# Patient Record
Sex: Female | Born: 1980 | Race: White | Hispanic: No | Marital: Married | Smoking: Former smoker
Health system: Southern US, Community
[De-identification: ages and names within clinical notes are randomized; demographics above are authoritative.]

## PROBLEM LIST (undated history)

## (undated) DIAGNOSIS — IMO0001 Reserved for inherently not codable concepts without codable children: Secondary | ICD-10-CM

## (undated) DIAGNOSIS — IMO0002 Reserved for concepts with insufficient information to code with codable children: Secondary | ICD-10-CM

## (undated) DIAGNOSIS — C801 Malignant (primary) neoplasm, unspecified: Secondary | ICD-10-CM

## (undated) DIAGNOSIS — L409 Psoriasis, unspecified: Secondary | ICD-10-CM

## (undated) DIAGNOSIS — F419 Anxiety disorder, unspecified: Secondary | ICD-10-CM

## (undated) HISTORY — DX: Anxiety disorder, unspecified: F41.9

## (undated) HISTORY — PX: ABDOMINAL HYSTERECTOMY: SHX81

## (undated) HISTORY — DX: Psoriasis, unspecified: L40.9

## (undated) HISTORY — DX: Reserved for concepts with insufficient information to code with codable children: IMO0002

## (undated) HISTORY — PX: OTHER SURGICAL HISTORY: SHX169

## (undated) HISTORY — DX: Reserved for inherently not codable concepts without codable children: IMO0001

---

## 1998-08-13 ENCOUNTER — Ambulatory Visit (HOSPITAL_COMMUNITY): Admission: RE | Admit: 1998-08-13 | Discharge: 1998-08-13 | Payer: Self-pay | Admitting: Pediatrics

## 1999-10-03 ENCOUNTER — Encounter: Payer: Self-pay | Admitting: Family Medicine

## 1999-10-03 ENCOUNTER — Encounter: Admission: RE | Admit: 1999-10-03 | Discharge: 1999-10-03 | Payer: Self-pay | Admitting: Family Medicine

## 2000-08-06 ENCOUNTER — Other Ambulatory Visit: Admission: RE | Admit: 2000-08-06 | Discharge: 2000-08-06 | Payer: Self-pay | Admitting: Obstetrics and Gynecology

## 2001-01-14 ENCOUNTER — Emergency Department (HOSPITAL_COMMUNITY): Admission: EM | Admit: 2001-01-14 | Discharge: 2001-01-14 | Payer: Self-pay | Admitting: Emergency Medicine

## 2001-05-14 ENCOUNTER — Other Ambulatory Visit: Admission: RE | Admit: 2001-05-14 | Discharge: 2001-05-14 | Payer: Self-pay | Admitting: Obstetrics and Gynecology

## 2002-09-01 ENCOUNTER — Ambulatory Visit (HOSPITAL_COMMUNITY): Admission: RE | Admit: 2002-09-01 | Discharge: 2002-09-01 | Payer: Self-pay | Admitting: *Deleted

## 2003-02-04 ENCOUNTER — Inpatient Hospital Stay (HOSPITAL_COMMUNITY): Admission: AD | Admit: 2003-02-04 | Discharge: 2003-02-06 | Payer: Self-pay | Admitting: *Deleted

## 2003-06-21 ENCOUNTER — Emergency Department (HOSPITAL_COMMUNITY): Admission: EM | Admit: 2003-06-21 | Discharge: 2003-06-21 | Payer: Self-pay | Admitting: Emergency Medicine

## 2006-09-20 ENCOUNTER — Other Ambulatory Visit: Admission: RE | Admit: 2006-09-20 | Discharge: 2006-09-20 | Payer: Self-pay | Admitting: Family Medicine

## 2007-03-31 ENCOUNTER — Inpatient Hospital Stay (HOSPITAL_COMMUNITY): Admission: AD | Admit: 2007-03-31 | Discharge: 2007-03-31 | Payer: Self-pay | Admitting: Gynecology

## 2007-07-15 ENCOUNTER — Inpatient Hospital Stay (HOSPITAL_COMMUNITY): Admission: AD | Admit: 2007-07-15 | Discharge: 2007-07-16 | Payer: Self-pay | Admitting: Obstetrics and Gynecology

## 2010-01-15 ENCOUNTER — Emergency Department (HOSPITAL_BASED_OUTPATIENT_CLINIC_OR_DEPARTMENT_OTHER): Admission: EM | Admit: 2010-01-15 | Discharge: 2010-01-15 | Payer: Self-pay | Admitting: Emergency Medicine

## 2010-09-19 ENCOUNTER — Other Ambulatory Visit: Admission: RE | Admit: 2010-09-19 | Discharge: 2010-09-19 | Payer: Self-pay | Admitting: Family Medicine

## 2011-09-25 LAB — CBC
HCT: 28.1 — ABNORMAL LOW
HCT: 32.7 — ABNORMAL LOW
Hemoglobin: 11.1 — ABNORMAL LOW
Hemoglobin: 9.6 — ABNORMAL LOW
MCHC: 33.9
MCHC: 34.2
MCV: 83
MCV: 83.6
Platelets: 202
Platelets: 231
RBC: 3.36 — ABNORMAL LOW
RBC: 3.94
RDW: 14.5 — ABNORMAL HIGH
RDW: 14.7 — ABNORMAL HIGH
WBC: 13 — ABNORMAL HIGH
WBC: 15.7 — ABNORMAL HIGH

## 2011-09-25 LAB — RPR: RPR Ser Ql: NONREACTIVE

## 2014-03-11 DIAGNOSIS — C801 Malignant (primary) neoplasm, unspecified: Secondary | ICD-10-CM

## 2014-03-11 HISTORY — DX: Malignant (primary) neoplasm, unspecified: C80.1

## 2014-03-15 ENCOUNTER — Other Ambulatory Visit: Payer: Self-pay | Admitting: Hematology & Oncology

## 2014-03-15 DIAGNOSIS — C539 Malignant neoplasm of cervix uteri, unspecified: Secondary | ICD-10-CM

## 2014-03-16 ENCOUNTER — Telehealth: Payer: Self-pay | Admitting: Hematology & Oncology

## 2014-03-16 NOTE — Telephone Encounter (Signed)
Left message for pt to call for 3-7 appointments

## 2014-03-16 NOTE — Telephone Encounter (Signed)
Pt aware of 4-7 to drink at 10am and be NPO 4 hrs prior

## 2014-03-16 NOTE — Telephone Encounter (Signed)
Called to give pt 3-7 appointments per in basket. The person who answered said I had the wrong number. Dr. Marin Olp aware

## 2014-03-17 ENCOUNTER — Encounter (HOSPITAL_BASED_OUTPATIENT_CLINIC_OR_DEPARTMENT_OTHER): Payer: Self-pay

## 2014-03-17 ENCOUNTER — Other Ambulatory Visit (HOSPITAL_BASED_OUTPATIENT_CLINIC_OR_DEPARTMENT_OTHER): Payer: BC Managed Care – PPO | Admitting: Lab

## 2014-03-17 ENCOUNTER — Ambulatory Visit (HOSPITAL_BASED_OUTPATIENT_CLINIC_OR_DEPARTMENT_OTHER)
Admission: RE | Admit: 2014-03-17 | Discharge: 2014-03-17 | Disposition: A | Discharge status: Home / Self Care | Payer: BC Managed Care – PPO | Source: Ambulatory Visit | Attending: Hematology & Oncology | Admitting: Hematology & Oncology

## 2014-03-17 DIAGNOSIS — C539 Malignant neoplasm of cervix uteri, unspecified: Secondary | ICD-10-CM

## 2014-03-17 DIAGNOSIS — C763 Malignant neoplasm of pelvis: Secondary | ICD-10-CM | POA: Insufficient documentation

## 2014-03-17 DIAGNOSIS — C762 Malignant neoplasm of abdomen: Secondary | ICD-10-CM | POA: Insufficient documentation

## 2014-03-17 HISTORY — DX: Malignant (primary) neoplasm, unspecified: C80.1

## 2014-03-17 LAB — CMP (CANCER CENTER ONLY)
ALBUMIN: 4.4 g/dL (ref 3.3–5.5)
ALT(SGPT): 15 U/L (ref 10–47)
AST: 16 U/L (ref 11–38)
Alkaline Phosphatase: 63 U/L (ref 26–84)
BUN, Bld: 11 mg/dL (ref 7–22)
CALCIUM: 9.6 mg/dL (ref 8.0–10.3)
CO2: 29 meq/L (ref 18–33)
CREATININE: 0.7 mg/dL (ref 0.6–1.2)
Chloride: 106 mEq/L (ref 98–108)
GLUCOSE: 93 mg/dL (ref 73–118)
Potassium: 4 mEq/L (ref 3.3–4.7)
Sodium: 138 mEq/L (ref 128–145)
Total Bilirubin: 0.8 mg/dl (ref 0.20–1.60)
Total Protein: 7.7 g/dL (ref 6.4–8.1)

## 2014-03-17 LAB — CBC WITH DIFFERENTIAL (CANCER CENTER ONLY)
BASO#: 0 10*3/uL (ref 0.0–0.2)
BASO%: 0.3 % (ref 0.0–2.0)
EOS ABS: 0.1 10*3/uL (ref 0.0–0.5)
EOS%: 0.7 % (ref 0.0–7.0)
HCT: 41.6 % (ref 34.8–46.6)
HGB: 14.2 g/dL (ref 11.6–15.9)
LYMPH#: 2 10*3/uL (ref 0.9–3.3)
LYMPH%: 17.4 % (ref 14.0–48.0)
MCH: 30.3 pg (ref 26.0–34.0)
MCHC: 34.1 g/dL (ref 32.0–36.0)
MCV: 89 fL (ref 81–101)
MONO#: 0.5 10*3/uL (ref 0.1–0.9)
MONO%: 4.5 % (ref 0.0–13.0)
NEUT#: 8.8 10*3/uL — ABNORMAL HIGH (ref 1.5–6.5)
NEUT%: 77.1 % (ref 39.6–80.0)
PLATELETS: 273 10*3/uL (ref 145–400)
RBC: 4.68 10*6/uL (ref 3.70–5.32)
RDW: 13.3 % (ref 11.1–15.7)
WBC: 11.4 10*3/uL — ABNORMAL HIGH (ref 3.9–10.0)

## 2014-03-17 LAB — FERRITIN CHCC: Ferritin: 44 ng/ml (ref 9–269)

## 2014-03-17 LAB — IRON AND TIBC CHCC
%SAT: 35 % (ref 21–57)
IRON: 104 ug/dL (ref 41–142)
TIBC: 298 ug/dL (ref 236–444)
UIBC: 194 ug/dL (ref 120–384)

## 2014-03-17 LAB — CHCC SATELLITE - SMEAR

## 2014-03-17 MED ORDER — IOHEXOL 300 MG/ML  SOLN
100.0000 mL | Freq: Once | INTRAMUSCULAR | Status: AC | PRN
  Administered 2014-03-17: 100 mL via INTRAVENOUS

## 2014-03-18 ENCOUNTER — Encounter: Payer: Self-pay | Admitting: *Deleted

## 2014-03-20 ENCOUNTER — Encounter: Payer: Self-pay | Admitting: Gynecology

## 2014-03-20 ENCOUNTER — Encounter: Payer: Self-pay | Admitting: Gynecologic Oncology

## 2014-03-20 ENCOUNTER — Ambulatory Visit: Payer: BC Managed Care – PPO | Attending: Gynecology | Admitting: Gynecology

## 2014-03-20 VITALS — BP 115/54 | HR 72 | Temp 98.7°F | Resp 16 | Ht 61.5 in | Wt 124.5 lb

## 2014-03-20 DIAGNOSIS — Z833 Family history of diabetes mellitus: Secondary | ICD-10-CM | POA: Insufficient documentation

## 2014-03-20 DIAGNOSIS — F411 Generalized anxiety disorder: Secondary | ICD-10-CM | POA: Insufficient documentation

## 2014-03-20 DIAGNOSIS — L408 Other psoriasis: Secondary | ICD-10-CM | POA: Insufficient documentation

## 2014-03-20 DIAGNOSIS — C539 Malignant neoplasm of cervix uteri, unspecified: Secondary | ICD-10-CM

## 2014-03-20 DIAGNOSIS — Z8249 Family history of ischemic heart disease and other diseases of the circulatory system: Secondary | ICD-10-CM | POA: Insufficient documentation

## 2014-03-20 DIAGNOSIS — Z79899 Other long term (current) drug therapy: Secondary | ICD-10-CM | POA: Insufficient documentation

## 2014-03-20 DIAGNOSIS — Z823 Family history of stroke: Secondary | ICD-10-CM | POA: Insufficient documentation

## 2014-03-20 NOTE — Patient Instructions (Addendum)
You will have Radical Hysterectomy surgery on Bobby 21 with Dr. Fermin Schwab.                Preparing for your Surgery  Pre-operative Testing -You will receive a phone call from presurgical testing at [facility] to arrange for a pre-operative testing appointment before your surgery.  This appointment normally occurs one to two weeks before your scheduled surgery.   -Bring your insurance card, copy of an advanced directive if applicable, medication list  -At that visit, you will be asked to sign a consent for a possible blood transfusion in case a transfusion becomes necessary during surgery.  The need for a blood transfusion is rare but having consent is a necessary part of your care.     Day Before Surgery at [place] will be asked to take in only clear liquids the day before surgery.  Examples of clear liquids include broths, jello, and clear juices.  You may also be advised to perform a fleets enema the night before your surgery based off of your provider's recommendations.  You will be advised to have nothing to eat or drink after midnight the evening before.    Your role in recovery Your role is to become active as soon as directed by your doctor, while still giving yourself time to heal.  Rest when you feel tired. You will be asked to do the following in order to speed your recovery:  - Cough and breathe deeply. This helps toclear and expand your lungs and can prevent pneumonia. You may be given a spirometer to practice deep breathing. A staff member will show you how to use the spirometer. - Do mild physical activity. Walking or moving your legs help your circulation and body functions return to normal. A staff member will help you when you try to walk and will provide you with simple exercises. Do not try to get up or walk alone the first time. - Actively manage your pain. Managing your pain lets you move in comfort. We will ask you to rate your pain on a scale of zero to 10.  It is your responsibility to tell your doctor or nurse where and how much you hurt so your pain can be treated. Suprapubic Catheter Home Guide A suprapubic catheter is a rubber tube with a tiny balloon on the end. It is used to drain urine from the bladder. This catheter is put in your bladder through a small opening in the lower center part of your abdomen. Suprapubic refers to the area right above your pubic bone. The balloon on the end of the catheter is filled with germ-free (sterile) water. This keeps the catheter from slipping out. When the catheter is in place, your urine will drain into a collection bag. The bag can be put beside your bed at night or attached to your leg during the day. HOW TO CARE FOR YOUR CATHETER  Cleaning your skin  Clean the skin around the catheter opening every day.  Wash your hands with soap and water.  Clean the skin around the opening with a clean washcloth and soapy water. Do not pull on the tube.  Pat the area dry with a clean towel.  Your caregiver may want you to put a bandage (dressing) over the site. Do not use ointment on this area unless your caregiver tells you to. Cleaning the catheter  Ask your caregiver if you need to clean the catheter and how often.  Use only  soap and water.  There may be crusts on the catheter. Put hydrogen peroxide on a cotton ball or gauze pad to remove any crust. Emptying the collection bag  You may have a large drainage bag to use at night and a smaller one for daytime. Empty the large bag every 8 hours. Empty the small bag when it is about  full.  Keep the drainage bag below the level of the catheter. This keeps urine from flowing backwards.  Hold the bag over the toilet or another container. Release the valve (spigot) at the bottom of the bag. Do not touch the opening of the spigot. Do not let the opening touch the toilet or container.  Close the spigot tightly when the bag is empty. Cleaning the collection  bag  Clean the bag every few days.  First, wash your hands.  Disconnect the tubing from the catheter. Replace the used bag with a new bag. Then you can clean the used one.  Empty the used bag completely. Rinse it out with warm water and soap or fill the bag with water and add 1 teaspoon of vinegar. Let it sit for about 30 minutes. Then drain.  The bag should be completely dry before storing it. Put it inside a plastic bag to keep it clean. Checking everything  Always make sure there are no kinks in the catheter or tubing.  Always make sure there are no leaks in the catheter, tubing, or collection bag. HOW TO CHANGE YOUR CATHETER Sometimes, a caregiver will change your suprapubic catheter. Other times, you may need to change it yourself. This may be the case if you need to wear a catheter for a long time. Usually, they need to be changed every 4 to 6 weeks. Ask your caregiver how often yours should be changed. Your caregiver will help you order the following supplies for home delivery:  Sterile gloves.  Catheters.  Syringes.  Sterile water.  Sterile cleaning solution.  Lubricant.  Drainage bags. Changing your catheter  Drink plenty of fluids before changing the catheter.  Wash your hands with soap and water.  Lie on your back and put on sterile gloves.  Clean the skin around the catheter opening. Use the sterile cleaning solution.  Use a syringe to get the water out of the balloon from the old catheter.  Slowly remove the catheter.  Take off the first pair of gloves, and put on a new pair. Then put lubricant on the tip of the new catheter. Put the new catheter through the opening.  Wait for some urine to start flowing. Then, use the other syringe to fill the balloon with sterile water.  Attach the catheter to your drainage bag. Make sure the connection is tight. Important warnings  The catheter should come out easily. If it seems stuck, do not pull it.  Call  your caregiver right away if you have any trouble while changing the catheter.  When the old catheter is removed, the new one should be put in right away. This is because the opening will close quickly. If you have a problem, go to an emergency clinic right away. RISKS AND COMPLICATIONS  Urine flow can become blocked. This can happen if the catheter or tubes are not working right. A blood clot can also block urine flow.  The catheter might irritate tissue in your body. This can cause bleeding.  The skin near the opening for the catheter may become irritated or infected.  Bacteria may get  into your bladder. This can cause a urinary tract infection. HOME CARE INSTRUCTIONS  Take all medicines prescribed by your caregiver. Follow the directions carefully.  Drink 8 glasses of water every day. This produces good urine flow.  Check the skin around your catheter a few times every day. Watch for redness and swelling. Look for any fluids coming out of the opening.  Do not use powder or cream around the catheter opening.  Do not take tub baths or use pools or hot tubs.  Keep all follow-up appointments. SEEK MEDICAL CARE IF:  You leak urine.  Your skin around the catheter becomes red or sore.  Your urine flow slows down.  Your urine gets cloudy or smelly. SEEK IMMEDIATE MEDICAL CARE IF:   You have chills, nausea, or back pain.  You have trouble changing your catheter.  Your catheter comes out.  You have blood in your urine.  You have no urine flow for 1 hour.  You have a fever. Document Released: 08/15/2011 Document Revised: 02/19/2012 Document Reviewed: 08/15/2011 [facility] Patient Information 2014 [place].

## 2014-03-20 NOTE — Progress Notes (Signed)
Consult Note: Gyn-Onc   Stacy Buck 33 y.o. female  Chief Complaint  Patient presents with  . 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 The risks of surgery including hemorrhage, infection, injury to adjacent viscera, and thromboembolic complications were discussed. Bladder dysfunction and fistula were also mentioned as possible serious complications. She understands she'll have a suprapubic catheter. All questions are answered. Patient's aware that radiation therapy as an alternative which is equally good.   HPI:  33 year old gravida 2 para 2 seen in consultation request of Dr. Christophe Louis regarding management of a newly diagnosed cervical carcinoma. Patient believes she had her last Pap smear in 2011. Recently she had a Pap smear showing atypical squamous cells and high-grade dysplasia cannot be ruled out. 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 In retrospect the patient reports she's had postcoital bleeding for several months. She denies any pelvic pain or any GI or GU symptoms. Functional status is excellent.  She had a CT scan obtained on Brigid 7 that shows no evidence of metastatic disease  Patient has no other serious medical problems.  Review of Systems:10 point review of systems is negative except as noted in interval history.   Vitals: Blood pressure 115/54, pulse 72, temperature 98.7 F (37.1 C), temperature source Oral, resp. rate 16, height 5' 1.5" (1.562 m), weight 124 lb 8 oz (56.473 kg), last menstrual period 03/03/2014.  Physical Exam: General : The patient is a healthy woman in no acute distress.  HEENT: normocephalic, extraoccular movements normal; neck is supple without thyromegally  Lynphnodes: Supraclavicular and inguinal nodes not enlarged  Abdomen: Soft, non-tender, no ascites, no  organomegally, no masses, no hernias  Pelvic:  EGBUS: Normal female  Vagina: Normal, no lesions  Urethra and Bladder: Normal, non-tender  Cervix: There is an exophytic lesion on the cervix measured approximately 3-1/2 cm occupying from approximately 12 to 6:00. Uterus: Anterior normal shape size and consistency.  Bi-manual examination: Non-tender; no adenxal masses or nodularity  Rectal: normal sphincter tone, no masses, no blood , there is no parametrial involvement Lower extremities: No edema or varicosities. Normal range of motion      No Known Allergies  Past Medical History  Diagnosis Date  . Cancer 03/11/2014    cervical cancer  . Anxiety   . Psoriasis     History reviewed. No pertinent past surgical history.  Current Outpatient Prescriptions  Medication Sig Dispense Refill  . ALPRAZolam (XANAX) 0.5 MG tablet Take 0.5 mg by mouth 2 (two) times daily.      Marland Kitchen venlafaxine XR (EFFEXOR-XR) 75 MG 24 hr capsule Take 75 mg by mouth daily with breakfast.       No current facility-administered medications for this visit.    History   Social History  . Marital Status: Single    Spouse Name: N/A    Number of Children: N/A  . Years of Education: N/A   Occupational History  . Not on file.   Social History Main Topics  . Smoking status: Not on file  . Smokeless tobacco: Not on file  . Alcohol Use: Not on file  . Drug Use: Not on file  . Sexual Activity: Not on file   Other Topics Concern  . Not on file   Social History Narrative  . No narrative on file    Family  History  Problem Relation Age of Onset  . Diabetes Father   . Coronary artery disease Father   . Stroke Maternal Grandfather   . Diabetes Paternal [place], MD 03/20/2014, 10:39 AM

## 2014-03-23 NOTE — Progress Notes (Signed)
Surgery on 03/31/14.  Preop on 4/15 at 0900am.   Need orders in EPIC.  Thank You.

## 2014-03-24 ENCOUNTER — Encounter (HOSPITAL_COMMUNITY): Payer: Self-pay | Admitting: Pharmacy Technician

## 2014-03-25 ENCOUNTER — Encounter (HOSPITAL_COMMUNITY): Payer: Self-pay

## 2014-03-25 ENCOUNTER — Encounter (HOSPITAL_COMMUNITY)
Admission: RE | Admit: 2014-03-25 | Discharge: 2014-03-25 | Disposition: A | Discharge status: Home / Self Care | Payer: BC Managed Care – PPO | Source: Ambulatory Visit | Attending: Gynecology | Admitting: Gynecology

## 2014-03-25 DIAGNOSIS — Z01812 Encounter for preprocedural laboratory examination: Secondary | ICD-10-CM | POA: Insufficient documentation

## 2014-03-25 LAB — URINALYSIS, ROUTINE W REFLEX MICROSCOPIC
Bilirubin Urine: NEGATIVE
GLUCOSE, UA: NEGATIVE mg/dL
Ketones, ur: NEGATIVE mg/dL
Nitrite: NEGATIVE
Protein, ur: NEGATIVE mg/dL
SPECIFIC GRAVITY, URINE: 1.01 (ref 1.005–1.030)
Urobilinogen, UA: 0.2 mg/dL (ref 0.0–1.0)
pH: 7.5 (ref 5.0–8.0)

## 2014-03-25 LAB — COMPREHENSIVE METABOLIC PANEL
ALT: 11 U/L (ref 0–35)
AST: 12 U/L (ref 0–37)
Albumin: 4.6 g/dL (ref 3.5–5.2)
Alkaline Phosphatase: 72 U/L (ref 39–117)
BUN: 13 mg/dL (ref 6–23)
CO2: 24 mEq/L (ref 19–32)
CREATININE: 0.73 mg/dL (ref 0.50–1.10)
Calcium: 10 mg/dL (ref 8.4–10.5)
Chloride: 105 mEq/L (ref 96–112)
GFR calc Af Amer: >90 mL/min (ref >90)
GLUCOSE: 96 mg/dL (ref 70–99)
Potassium: 4.1 mEq/L (ref 3.7–5.3)
SODIUM: 141 meq/L (ref 137–147)
Total Bilirubin: 0.3 mg/dL (ref 0.3–1.2)
Total Protein: 7.6 g/dL (ref 6.0–8.3)

## 2014-03-25 LAB — CBC WITH DIFFERENTIAL/PLATELET
Basophils Absolute: 0 10*3/uL (ref 0.0–0.1)
Basophils Relative: 0 % (ref 0–1)
EOS PCT: 1 % (ref 0–5)
Eosinophils Absolute: 0.1 10*3/uL (ref 0.0–0.7)
HCT: 39.9 % (ref 36.0–46.0)
Hemoglobin: 13.7 g/dL (ref 12.0–15.0)
LYMPHS ABS: 1.9 10*3/uL (ref 0.7–4.0)
LYMPHS PCT: 19 % (ref 12–46)
MCH: 30.3 pg (ref 26.0–34.0)
MCHC: 34.3 g/dL (ref 30.0–36.0)
MCV: 88.3 fL (ref 78.0–100.0)
MONO ABS: 0.4 10*3/uL (ref 0.1–1.0)
Monocytes Relative: 5 % (ref 3–12)
NEUTROS ABS: 7.2 10*3/uL (ref 1.7–7.7)
Neutrophils Relative %: 75 % (ref 43–77)
Platelets: 251 10*3/uL (ref 150–400)
RBC: 4.52 MIL/uL (ref 3.87–5.11)
RDW: 13.4 % (ref 11.5–15.5)
WBC: 9.7 10*3/uL (ref 4.0–10.5)

## 2014-03-25 LAB — PREGNANCY, URINE: Preg Test, Ur: NEGATIVE

## 2014-03-25 LAB — URINE MICROSCOPIC-ADD ON

## 2014-03-25 LAB — ABO/RH: ABO/RH(D): A POS

## 2014-03-25 NOTE — Patient Instructions (Addendum)
18 Stacy Buck  03/25/2014   Your procedure is scheduled on: 4-21   -2015  Report to [facility] at     0900   AM.  Call this number if you have problems the morning of surgery: [phone]  Or Presurgical Testing [phone](A[place] Palomarez) For Living Will and/or Health Care Power Attorney Forms: please provide copy for your medical record,may bring AM of surgery(Forms should be already notarized -we do not provide this service).(03-25-14 Patient prefers no further information today)  Remember: Follow any bowel prep instructions per MD office.   Do not eat food:After Midnight.    Take these medicines the morning of surgery with A SIP OF WATER: Effexor. Xanax.   Do not wear jewelry, make-up or nail polish.  Do not wear lotions, powders, or perfumes. You may wear deodorant.  Do not shave 48 hours(2 days) prior to first CHG shower(legs and under arms).(Shaving face and neck okay.)  Do not bring valuables to the hospital.(Hospital is not responsible for lost valuables).  Contacts, dentures or removable bridgework, body piercing, hair pins may not be worn into surgery.  Leave suitcase in the car. After surgery it may be brought to your room.  For patients admitted to the hospital, checkout time is 11:00 AM the day of discharge.(Restricted visitors-Any Persons displaying flu-like symptoms or illness).    Patients discharged the day of surgery will not be allowed to drive home. Must have responsible person with you x 24 hours once discharged.  Name and phone number of your driver: Imanni Burdine -spouse [phone] cell  Special Instructions: CHG(Chlorhedine 4%-"Hibiclens","Betasept","Aplicare") Shower Use Special Wash: see special instructions.(avoid face and genitals)   Please read over the following fact sheets that you were given: MRSA Information, Blood Transfusion fact sheet, Incentive Spirometry Instruction.  Remember : Type/Screen "Blue armbands" - may not be removed once  applied(would result in being retested AM of surgery, if removed).  Failure to follow these instructions may result in Cancellation of your surgery.   Patient signature_______________________________________________________

## 2014-03-25 NOTE — Progress Notes (Signed)
03-25-14 1600 Labs viewable in Epic-note urinalysis.

## 2014-03-25 NOTE — Pre-Procedure Instructions (Addendum)
03-25-14 CT Chest/abd -4'15 Epic 03-25-14 1600 Labs viewable in Epic-note sent [phone] to note urinalysis. 03-26-14 [place] called to say she entered order for Urine culture after review of urinalysis results in Epic. Lab made aware by release of order-confirmed urine remains available in lab. Stacy Buck

## 2014-03-27 LAB — URINE CULTURE
Colony Count: 70000
SPECIAL REQUESTS: NORMAL

## 2014-03-30 ENCOUNTER — Telehealth: Payer: Self-pay | Admitting: *Deleted

## 2014-03-30 NOTE — Telephone Encounter (Signed)
Called pt, with reminder for clear liquids, NPO after midnight, Fleets enema prior to bedtime. Pt verbalized understanding. No further concern.

## 2014-03-31 ENCOUNTER — Encounter (HOSPITAL_COMMUNITY)
Admission: RE | Disposition: A | Discharge status: Home / Self Care | Payer: Self-pay | Source: Ambulatory Visit | Attending: Obstetrics & Gynecology

## 2014-03-31 ENCOUNTER — Inpatient Hospital Stay (HOSPITAL_COMMUNITY): Payer: BC Managed Care – PPO | Admitting: Registered Nurse

## 2014-03-31 ENCOUNTER — Encounter (HOSPITAL_COMMUNITY): Payer: Self-pay | Admitting: *Deleted

## 2014-03-31 ENCOUNTER — Inpatient Hospital Stay (HOSPITAL_COMMUNITY)
Admission: RE | Admit: 2014-03-31 | Discharge: 2014-04-03 | DRG: 735 | Disposition: A | Discharge status: Home / Self Care | Payer: BC Managed Care – PPO | Source: Ambulatory Visit | Attending: Obstetrics & Gynecology | Admitting: Obstetrics & Gynecology

## 2014-03-31 ENCOUNTER — Encounter (HOSPITAL_COMMUNITY): Payer: BC Managed Care – PPO | Admitting: Registered Nurse

## 2014-03-31 DIAGNOSIS — Z833 Family history of diabetes mellitus: Secondary | ICD-10-CM

## 2014-03-31 DIAGNOSIS — F411 Generalized anxiety disorder: Secondary | ICD-10-CM | POA: Diagnosis present

## 2014-03-31 DIAGNOSIS — C539 Malignant neoplasm of cervix uteri, unspecified: Secondary | ICD-10-CM

## 2014-03-31 DIAGNOSIS — Z823 Family history of stroke: Secondary | ICD-10-CM

## 2014-03-31 DIAGNOSIS — Z8249 Family history of ischemic heart disease and other diseases of the circulatory system: Secondary | ICD-10-CM

## 2014-03-31 HISTORY — PX: RADICAL HYSTERECTOMY: SHX2283

## 2014-03-31 LAB — TYPE AND SCREEN
ABO/RH(D): A POS
ANTIBODY SCREEN: NEGATIVE

## 2014-03-31 SURGERY — HYSTERECTOMY, RADICAL, ABDOMINAL
Anesthesia: General

## 2014-03-31 MED ORDER — ROCURONIUM BROMIDE 100 MG/10ML IV SOLN
INTRAVENOUS | Status: DC | PRN
  Administered 2014-03-31: 35 mg via INTRAVENOUS
  Administered 2014-03-31 (×2): 5 mg via INTRAVENOUS
  Administered 2014-03-31: 10 mg via INTRAVENOUS

## 2014-03-31 MED ORDER — KETOROLAC TROMETHAMINE 30 MG/ML IJ SOLN
30.0000 mg | Freq: Four times a day (QID) | INTRAMUSCULAR | Status: AC
  Administered 2014-03-31 – 2014-04-01 (×4): 30 mg via INTRAVENOUS
  Filled 2014-03-31 (×3): qty 1

## 2014-03-31 MED ORDER — ONDANSETRON HCL 4 MG PO TABS: 4.0000 mg | ORAL_TABLET | Freq: Four times a day (QID) | ORAL | Status: DC | PRN

## 2014-03-31 MED ORDER — FENTANYL CITRATE 0.05 MG/ML IJ SOLN
INTRAMUSCULAR | Status: AC
  Filled 2014-03-31: qty 5

## 2014-03-31 MED ORDER — ONDANSETRON HCL 4 MG/2ML IJ SOLN
INTRAMUSCULAR | Status: AC
  Filled 2014-03-31: qty 2

## 2014-03-31 MED ORDER — ONDANSETRON HCL 4 MG/2ML IJ SOLN
4.0000 mg | Freq: Four times a day (QID) | INTRAMUSCULAR | Status: DC | PRN
  Administered 2014-03-31 – 2014-04-01 (×3): 4 mg via INTRAVENOUS
  Filled 2014-03-31 (×4): qty 2

## 2014-03-31 MED ORDER — MIDAZOLAM HCL 2 MG/2ML IJ SOLN
INTRAMUSCULAR | Status: AC
  Filled 2014-03-31: qty 2

## 2014-03-31 MED ORDER — LACTATED RINGERS IV SOLN: INTRAVENOUS | Status: DC

## 2014-03-31 MED ORDER — KCL IN DEXTROSE-NACL 20-5-0.45 MEQ/L-%-% IV SOLN
INTRAVENOUS | Status: AC
  Filled 2014-03-31: qty 1000

## 2014-03-31 MED ORDER — FENTANYL CITRATE 0.05 MG/ML IJ SOLN
INTRAMUSCULAR | Status: DC | PRN
  Administered 2014-03-31 (×5): 50 ug via INTRAVENOUS

## 2014-03-31 MED ORDER — ACETAMINOPHEN 500 MG PO TABS
1000.0000 mg | ORAL_TABLET | Freq: Four times a day (QID) | ORAL | Status: DC
  Administered 2014-03-31 – 2014-04-03 (×10): 1000 mg via ORAL
  Filled 2014-03-31 (×14): qty 2

## 2014-03-31 MED ORDER — ENOXAPARIN SODIUM 40 MG/0.4ML ~~LOC~~ SOLN
40.0000 mg | SUBCUTANEOUS | Status: AC
  Administered 2014-03-31: 40 mg via SUBCUTANEOUS
  Filled 2014-03-31: qty 0.4

## 2014-03-31 MED ORDER — LIDOCAINE HCL (CARDIAC) 20 MG/ML IV SOLN
INTRAVENOUS | Status: AC
  Filled 2014-03-31: qty 5

## 2014-03-31 MED ORDER — GLYCOPYRROLATE 0.2 MG/ML IJ SOLN
INTRAMUSCULAR | Status: AC
  Filled 2014-03-31: qty 2

## 2014-03-31 MED ORDER — HYDROMORPHONE HCL PF 1 MG/ML IJ SOLN
0.5000 mg | INTRAMUSCULAR | Status: DC | PRN
  Administered 2014-03-31 – 2014-04-01 (×3): 0.5 mg via INTRAVENOUS
  Filled 2014-03-31 (×3): qty 1

## 2014-03-31 MED ORDER — VENLAFAXINE HCL ER 75 MG PO CP24
75.0000 mg | ORAL_CAPSULE | Freq: Every day | ORAL | Status: DC
  Administered 2014-04-01 – 2014-04-03 (×3): 75 mg via ORAL
  Filled 2014-03-31 (×4): qty 1

## 2014-03-31 MED ORDER — BUPIVACAINE LIPOSOME 1.3 % IJ SUSP
20.0000 mL | Freq: Once | INTRAMUSCULAR | Status: DC
  Filled 2014-03-31: qty 20

## 2014-03-31 MED ORDER — KETOROLAC TROMETHAMINE 30 MG/ML IJ SOLN
INTRAMUSCULAR | Status: AC
  Filled 2014-03-31: qty 1

## 2014-03-31 MED ORDER — DEXAMETHASONE SODIUM PHOSPHATE 10 MG/ML IJ SOLN
INTRAMUSCULAR | Status: AC
  Filled 2014-03-31: qty 1

## 2014-03-31 MED ORDER — MAGNESIUM HYDROXIDE 400 MG/5ML PO SUSP
30.0000 mL | Freq: Three times a day (TID) | ORAL | Status: AC
  Administered 2014-03-31 – 2014-04-01 (×3): 30 mL via ORAL
  Filled 2014-03-31 (×3): qty 30

## 2014-03-31 MED ORDER — ONDANSETRON HCL 4 MG/2ML IJ SOLN
INTRAMUSCULAR | Status: DC | PRN
  Administered 2014-03-31: 4 mg via INTRAVENOUS

## 2014-03-31 MED ORDER — PROPOFOL 10 MG/ML IV BOLUS
INTRAVENOUS | Status: DC | PRN
  Administered 2014-03-31: 150 mg via INTRAVENOUS

## 2014-03-31 MED ORDER — MIDAZOLAM HCL 5 MG/5ML IJ SOLN
INTRAMUSCULAR | Status: DC | PRN
  Administered 2014-03-31: 2 mg via INTRAVENOUS

## 2014-03-31 MED ORDER — NEOSTIGMINE METHYLSULFATE 1 MG/ML IJ SOLN
INTRAMUSCULAR | Status: DC | PRN
  Administered 2014-03-31: 3 mg via INTRAVENOUS

## 2014-03-31 MED ORDER — GLYCOPYRROLATE 0.2 MG/ML IJ SOLN
INTRAMUSCULAR | Status: DC | PRN
  Administered 2014-03-31: 0.4 mg via INTRAVENOUS

## 2014-03-31 MED ORDER — EPHEDRINE SULFATE 50 MG/ML IJ SOLN
INTRAMUSCULAR | Status: DC | PRN
  Administered 2014-03-31: 5 mg via INTRAVENOUS

## 2014-03-31 MED ORDER — CEFAZOLIN SODIUM-DEXTROSE 2-3 GM-% IV SOLR
2.0000 g | INTRAVENOUS | Status: AC
Start: 2014-03-31 — End: 2014-03-31
  Administered 2014-03-31: 2 g via INTRAVENOUS

## 2014-03-31 MED ORDER — HYDROMORPHONE HCL PF 1 MG/ML IJ SOLN
INTRAMUSCULAR | Status: DC | PRN
  Administered 2014-03-31 (×4): 0.5 mg via INTRAVENOUS

## 2014-03-31 MED ORDER — KCL IN DEXTROSE-NACL 20-5-0.45 MEQ/L-%-% IV SOLN
INTRAVENOUS | Status: DC
  Administered 2014-03-31 – 2014-04-02 (×4): via INTRAVENOUS
  Filled 2014-03-31 (×5): qty 1000

## 2014-03-31 MED ORDER — ENSURE COMPLETE PO LIQD
237.0000 mL | Freq: Two times a day (BID) | ORAL | Status: DC
  Administered 2014-04-02 – 2014-04-03 (×2): 237 mL via ORAL

## 2014-03-31 MED ORDER — HYDROMORPHONE HCL PF 2 MG/ML IJ SOLN
INTRAMUSCULAR | Status: AC
  Filled 2014-03-31: qty 1

## 2014-03-31 MED ORDER — BUPIVACAINE LIPOSOME 1.3 % IJ SUSP
INTRAMUSCULAR | Status: DC | PRN
  Administered 2014-03-31: 20 mL

## 2014-03-31 MED ORDER — HYDROMORPHONE HCL PF 1 MG/ML IJ SOLN
0.2500 mg | INTRAMUSCULAR | Status: DC | PRN
  Administered 2014-03-31: 0.25 mg via INTRAVENOUS
  Administered 2014-03-31: 0.5 mg via INTRAVENOUS

## 2014-03-31 MED ORDER — MEPERIDINE HCL 50 MG/ML IJ SOLN: 6.2500 mg | INTRAMUSCULAR | Status: DC | PRN

## 2014-03-31 MED ORDER — HYDROMORPHONE HCL PF 1 MG/ML IJ SOLN
INTRAMUSCULAR | Status: AC
  Filled 2014-03-31: qty 1

## 2014-03-31 MED ORDER — DEXAMETHASONE SODIUM PHOSPHATE 10 MG/ML IJ SOLN
INTRAMUSCULAR | Status: DC | PRN
  Administered 2014-03-31: 10 mg via INTRAVENOUS

## 2014-03-31 MED ORDER — CEFAZOLIN SODIUM-DEXTROSE 2-3 GM-% IV SOLR
INTRAVENOUS | Status: AC
  Filled 2014-03-31: qty 50

## 2014-03-31 MED ORDER — PROPOFOL 10 MG/ML IV BOLUS
INTRAVENOUS | Status: AC
  Filled 2014-03-31: qty 20

## 2014-03-31 MED ORDER — ALPRAZOLAM 0.25 MG PO TABS: 0.2500 mg | ORAL_TABLET | Freq: Two times a day (BID) | ORAL | Status: DC | PRN

## 2014-03-31 MED ORDER — LACTATED RINGERS IV SOLN
INTRAVENOUS | Status: DC
  Administered 2014-03-31: 1000 mL via INTRAVENOUS

## 2014-03-31 MED ORDER — KETOROLAC TROMETHAMINE 30 MG/ML IJ SOLN
30.0000 mg | Freq: Four times a day (QID) | INTRAMUSCULAR | Status: AC
  Filled 2014-03-31 (×3): qty 1

## 2014-03-31 MED ORDER — PROMETHAZINE HCL 25 MG/ML IJ SOLN: 6.2500 mg | INTRAMUSCULAR | Status: DC | PRN

## 2014-03-31 MED ORDER — 0.9 % SODIUM CHLORIDE (POUR BTL) OPTIME
TOPICAL | Status: DC | PRN
  Administered 2014-03-31: 2000 mL

## 2014-03-31 MED ORDER — OXYCODONE HCL 5 MG PO TABS
10.0000 mg | ORAL_TABLET | ORAL | Status: DC | PRN
  Administered 2014-04-01: 10 mg via ORAL
  Filled 2014-03-31: qty 2

## 2014-03-31 SURGICAL SUPPLY — 47 items
ATTRACTOMAT 16X20 MAGNETIC DRP (DRAPES) ×3 IMPLANT
BLADE EXTENDED COATED 6.5IN (ELECTRODE) ×3 IMPLANT
CANISTER SUCTION 2500CC (MISCELLANEOUS) ×1 IMPLANT
CATH FOLEY 2WAY  5CC 16FR SIL (CATHETERS) ×2
CATH FOLEY 2WAY 5CC 16FR SIL (CATHETERS) IMPLANT
CLIP TI LARGE 6 (CLIP) ×2 IMPLANT
CLIP TI MEDIUM 6 (CLIP) ×2 IMPLANT
CLIP TI MEDIUM LARGE 6 (CLIP) ×8 IMPLANT
CONT SPEC 4OZ CLIKSEAL STRL BL (MISCELLANEOUS) ×1 IMPLANT
COVER SURGICAL LIGHT HANDLE (MISCELLANEOUS) ×3 IMPLANT
DRAPE UTILITY XL STRL (DRAPES) ×2 IMPLANT
DRAPE WARM FLUID 44X44 (DRAPE) ×3 IMPLANT
ELECT REM PT RETURN 9FT ADLT (ELECTROSURGICAL) ×3
ELECTRODE REM PT RTRN 9FT ADLT (ELECTROSURGICAL) ×1 IMPLANT
GAUZE SPONGE 4X4 16PLY XRAY LF (GAUZE/BANDAGES/DRESSINGS) ×1 IMPLANT
GLOVE BIO SURGEON STRL SZ 6.5 (GLOVE) ×2 IMPLANT
GLOVE BIO SURGEONS STRL SZ 6.5 (GLOVE) ×1
GLOVE BIOGEL M STRL SZ7.5 (GLOVE) ×9 IMPLANT
GOWN STRL REUS W/TWL LRG LVL3 (GOWN DISPOSABLE) ×3 IMPLANT
KIT BASIN OR (CUSTOM PROCEDURE TRAY) ×3 IMPLANT
NEEDLE HYPO 22GX1.5 SAFETY (NEEDLE) ×2 IMPLANT
NS IRRIG 1000ML POUR BTL (IV SOLUTION) ×4 IMPLANT
PACK GENERAL/GYN (CUSTOM PROCEDURE TRAY) ×3 IMPLANT
RELOAD BLUE (STAPLE) IMPLANT
RELOAD WHITE ECR60W (STAPLE) ×10 IMPLANT
SHEET LAVH (DRAPES) ×3 IMPLANT
SPONGE GAUZE 4X4 12PLY (GAUZE/BANDAGES/DRESSINGS) ×1 IMPLANT
SPONGE LAP 18X18 X RAY DECT (DISPOSABLE) ×6 IMPLANT
STAPLE ECHEON FLEX 60 POW ENDO (STAPLE) ×3 IMPLANT
STAPLER VISISTAT 35W (STAPLE) ×2 IMPLANT
SUT ETHILON 1 LR 30 (SUTURE) IMPLANT
SUT PDS AB 1 CTXB1 36 (SUTURE) ×6 IMPLANT
SUT SILK 2 0 (SUTURE) ×3
SUT SILK 2 0 30  PSL (SUTURE)
SUT SILK 2 0 30 PSL (SUTURE) IMPLANT
SUT SILK 2-0 18XBRD TIE 12 (SUTURE) ×1 IMPLANT
SUT VIC AB 0 CT1 36 (SUTURE) ×11 IMPLANT
SUT VIC AB 2-0 CT2 27 (SUTURE) ×10 IMPLANT
SUT VIC AB 2-0 SH 27 (SUTURE) ×21
SUT VIC AB 2-0 SH 27X BRD (SUTURE) ×6 IMPLANT
SUT VIC AB 3-0 CTX 36 (SUTURE) IMPLANT
SUT VICRYL 2 0 18  UND BR (SUTURE) ×2
SUT VICRYL 2 0 18 UND BR (SUTURE) ×1 IMPLANT
SYR 20CC LL (SYRINGE) ×2 IMPLANT
TRAY FOLEY CATH 14FRSI W/METER (CATHETERS) ×1 IMPLANT
TRAY FOLEY CATH 16FRSI W/METER (SET/KITS/TRAYS/PACK) ×2 IMPLANT
WATER STERILE IRR 1500ML POUR (IV SOLUTION) ×1 IMPLANT

## 2014-03-31 NOTE — Anesthesia Postprocedure Evaluation (Signed)
  Anesthesia Post-op Note  Patient: Stacy Buck  Procedure(s) Performed: Procedure(s) (LRB): EXPLORATORY LAPAROTOMY/RADICAL HYSTERECTOMY/LYMPHNODE DISECTION (N/A)  Patient Location: PACU  Anesthesia Type: General  Level of Consciousness: awake and alert   Airway and Oxygen Therapy: Patient Spontanous Breathing  Post-op Pain: mild  Post-op Assessment: Post-op Vital signs reviewed, Patient's Cardiovascular Status Stable, Respiratory Function Stable, Patent Airway and No signs of Nausea or vomiting  Last Vitals:  Filed Vitals:   03/31/14 1517  BP: 116/65  Pulse: 62  Temp: 36.8 C  Resp: 12    Post-op Vital Signs: stable   Complications: No apparent anesthesia complications

## 2014-03-31 NOTE — Transfer of Care (Signed)
Immediate Anesthesia Transfer of Care Note  Patient: Stacy Buck  Procedure(s) Performed: Procedure(s): EXPLORATORY LAPAROTOMY/RADICAL HYSTERECTOMY/LYMPHNODE DISECTION (N/A)  Patient Location: PACU  Anesthesia Type:General  Level of Consciousness: awake, alert , oriented and patient cooperative  Airway & Oxygen Therapy: Patient Spontanous Breathing and Patient connected to face mask oxygen  Post-op Assessment: Report given to PACU RN, Post -op Vital signs reviewed and stable and Patient moving all extremities  Post vital signs: Reviewed and stable  Complications: No apparent anesthesia complications

## 2014-03-31 NOTE — H&P (View-Only) (Signed)
Consult Note: Gyn-Onc   Stacy Buck 33 y.o. female  Chief Complaint  Patient presents with  . 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 The risks of surgery including hemorrhage, infection, injury to adjacent viscera, and thromboembolic complications were discussed. Bladder dysfunction and fistula were also mentioned as possible serious complications. She understands she'll have a suprapubic catheter. All questions are answered. Patient's aware that radiation therapy as an alternative which is equally good.   HPI:  33 year old gravida 2 para 2 seen in consultation request of Dr. Christophe Louis regarding management of a newly diagnosed cervical carcinoma. Patient believes she had her last Pap smear in 2011. Recently she had a Pap smear showing atypical squamous cells and high-grade dysplasia cannot be ruled out. 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 In retrospect the patient reports she's had postcoital bleeding for several months. She denies any pelvic pain or any GI or GU symptoms. Functional status is excellent.  She had a CT scan obtained on Brigid 7 that shows no evidence of metastatic disease  Patient has no other serious medical problems.  Review of Systems:10 point review of systems is negative except as noted in interval history.   Vitals: Blood pressure 115/54, pulse 72, temperature 98.7 F (37.1 C), temperature source Oral, resp. rate 16, height 5' 1.5" (1.562 m), weight 124 lb 8 oz (56.473 kg), last menstrual period 03/03/2014.  Physical Exam: General : The patient is a healthy woman in no acute distress.  HEENT: normocephalic, extraoccular movements normal; neck is supple without thyromegally  Lynphnodes: Supraclavicular and inguinal nodes not enlarged  Abdomen: Soft, non-tender, no ascites, no  organomegally, no masses, no hernias  Pelvic:  EGBUS: Normal female  Vagina: Normal, no lesions  Urethra and Bladder: Normal, non-tender  Cervix: There is an exophytic lesion on the cervix measured approximately 3-1/2 cm occupying from approximately 12 to 6:00. Uterus: Anterior normal shape size and consistency.  Bi-manual examination: Non-tender; no adenxal masses or nodularity  Rectal: normal sphincter tone, no masses, no blood , there is no parametrial involvement Lower extremities: No edema or varicosities. Normal range of motion      No Known Allergies  Past Medical History  Diagnosis Date  . Cancer 03/11/2014    cervical cancer  . Anxiety   . Psoriasis     History reviewed. No pertinent past surgical history.  Current Outpatient Prescriptions  Medication Sig Dispense Refill  . ALPRAZolam (XANAX) 0.5 MG tablet Take 0.5 mg by mouth 2 (two) times daily.      Marland Kitchen venlafaxine XR (EFFEXOR-XR) 75 MG 24 hr capsule Take 75 mg by mouth daily with breakfast.       No current facility-administered medications for this visit.    History   Social History  . Marital Status: Single    Spouse Name: N/A    Number of Children: N/A  . Years of Education: N/A   Occupational History  . Not on file.   Social History Main Topics  . Smoking status: Not on file  . Smokeless tobacco: Not on file  . Alcohol Use: Not on file  . Drug Use: Not on file  . Sexual Activity: Not on file   Other Topics Concern  . Not on file   Social History Narrative  . No narrative on file    Family  History  Problem Relation Age of Onset  . Diabetes Father   . Coronary artery disease Father   . Stroke Maternal Grandfather   . Diabetes Paternal [place], MD 03/20/2014, 10:39 AM

## 2014-03-31 NOTE — Op Note (Signed)
Stacy Buck  female [place] LZ:[id number] DATE OF BIRTH: Stacy Buck 19, 1982 PHYSICIAN: Marti Sleigh, M.D  03/31/2014   OPERATIVE REPORT  PREOPERATIVE DIAGNOSIS: Stage IBI squamous cell carcinoma of the cervix  POSTOPERATIVE DIAGNOSIS: Same  PROCEDURE: Radical of nominal hysterectomy, bilateral salpingectomy, bilateral pelvic lymphadenectomy, placement of suprapubic catheter, bilateral oophorpexy.   SURGEON: Marti Sleigh, M.D ASSISTANT: Lahoma Crocker, MD ANESTHESIA: Gen. with oral tracheal tube 100 mL ESTIMATED BLOOD LOSS:  SURGICAL FINDINGS: The cervix was occupied by an exophytic lesion from 12 to 6:00 measured approximately 3-1/2 cm in diameter. At the time of exploration was no evidence of intra-abdominal or retroperitoneal metastases. Tubes and ovaries appeared normal as did the appendix.  PROCEDURE: Patient brought to the operating room and after satisfactory attainment of general anesthesia was placed in a modified lithotomy position in [place]. The anterior bowel wall perineum and vagina were prepped, a Foley catheter was placed, and the patient was draped. Surgical timeout was taken. The abdomen was entered through a Pfannenstiel incision and explored. A Bookwalter retractor was positioned and the small bowel packed out of the pelvis. The uterus was grasped with 2 long Kelly clamps. The right round ligament was divided and the retroperitoneal spaces opened developing the pararectal and paravesical spaces. The iliac vessels and ureter identified. The superior vesicle artery was traced cephalad until the uterine artery was identified. This was isolated doubly clipped and divided. The remainder the cardinal ligament was divided using a linear stapling device along the pelvic sidewall. The ureter was mobilized from its attachment to the medial peritoneum. Similar procedure was performed on the left side the pelvis. The fallopian tubes removed by dividing the  mesosalpinx with cautery. The rectovaginal septum was developed with an incision along the posterior cul-de-sac. Uterosacral ligaments were isolated and then divided using a linear stapling device. The bladder peritoneum was incised the bladder advanced with sharp and blunt dissection from the cervix and upper vagina. The ureter was then mobilized out of its "tunnel" using sharp and blunt dissection, cautery for hemostasis, and occasional 2-0 Vicryl suture ligature. The ureter was freed up till it entered the bladder. The ureter was then pulled laterally and the posterior vesicle uterine ligament was clamped and divided bilaterally. The vaginal angles were then crossclamped and the vagina incised thus removing the uterus cervix upper vagina and fallopian tubes. The specimen was inspected and found to have an excellent vaginal margin. Vaginal angles transfixed with 0 Vicryl the central portion of vagina closed with interrupted figure-of-eight sutures of 0 Vicryl.  Attention was turned to performing a pelvic lymphadenectomy. All infected tissue overlying the external iliac artery and vein internal iliac artery and obturator fossa was removed. Hemostasis achieved with cautery and hemoclips. Similar procedures performed on both sides the pelvis.  The ovarian pedicles were marked with 2 large clips and then sutured into the paracolic gutters to move them out of the pelvis in case the patient require postoperative pelvic radiation therapy.  Space or Retzius was opened and the dome of the bladder in size. Through a stab wound in the suprapubic region a 16 Silastic Foley catheter was introduced across the skin in the bladder. The bladder was closed with a running suture of 2-0 Vicryl.  The pelvis was reirrigated and found to be hemostatic. Retractors and packs removed. The anterior bowel wall was closed in layers the first being a running suture of 0 Vicryl the peritoneum. The fascia was closed in running suture of  0 PDS. Subcutaneous tissue was  irrigated and cautery achieved hemostasis.Experil was injected in the subcutaneous layer. The skin was closed in a running subcuticular suture of 3-0 Monocryl. Steri-Strips were applied. Dressing was applied. Patient was awakened from anesthesia and taken to the recovery room in satisfactory condition. Sponge needle and instrument counts correct x2.   Marti Sleigh, M.D

## 2014-03-31 NOTE — Interval H&P Note (Signed)
History and Physical Interval Note:  03/31/2014 10:43 AM  Stacy Buck  has presented today for surgery, with the diagnosis of cervical cancer  The various methods of treatment have been discussed with the patient and family. After consideration of risks, benefits and other options for treatment, the patient has consented to  Procedure(s): EXPLORATORY LAPAROTOMY/RADICAL HYSTERECTOMY/LYMPHNODE DISECTION (N/A) as a surgical intervention .  The patient's history has been reviewed, patient examined, no change in status, stable for surgery.  I have reviewed the patient's chart and labs.  Questions were answered to the patient's satisfaction.     [place]

## 2014-03-31 NOTE — Anesthesia Preprocedure Evaluation (Addendum)
Anesthesia Evaluation  Patient identified by MRN, date of birth, ID band Patient awake    Reviewed: Allergy & Precautions, H&P , NPO status , Patient's Chart, lab work & pertinent test results  Airway       Dental   Pulmonary neg pulmonary ROS, Current Smoker,          Cardiovascular negative cardio ROS      Neuro/Psych negative neurological ROS  negative psych ROS   GI/Hepatic negative GI ROS, Neg liver ROS,   Endo/Other  negative endocrine ROS  Renal/GU negative Renal ROS  negative genitourinary   Musculoskeletal negative musculoskeletal ROS (+)   Abdominal   Peds negative pediatric ROS (+)  Hematology negative hematology ROS (+)   Anesthesia Other Findings   Reproductive/Obstetrics negative OB ROS                          Anesthesia Physical Anesthesia Plan Anesthesia Quick Evaluation

## 2014-04-01 ENCOUNTER — Encounter (HOSPITAL_COMMUNITY): Payer: Self-pay | Admitting: Gynecology

## 2014-04-01 LAB — BASIC METABOLIC PANEL
BUN: 9 mg/dL (ref 6–23)
CHLORIDE: 104 meq/L (ref 96–112)
CO2: 25 mEq/L (ref 19–32)
Calcium: 8.3 mg/dL — ABNORMAL LOW (ref 8.4–10.5)
Creatinine, Ser: 0.67 mg/dL (ref 0.50–1.10)
GFR calc Af Amer: >90 mL/min (ref >90)
GFR calc non Af Amer: >90 mL/min (ref >90)
Glucose, Bld: 200 mg/dL — ABNORMAL HIGH (ref 70–99)
Potassium: 4.6 mEq/L (ref 3.7–5.3)
Sodium: 136 mEq/L — ABNORMAL LOW (ref 137–147)

## 2014-04-01 MED ORDER — PROMETHAZINE HCL 25 MG/ML IJ SOLN
6.2500 mg | Freq: Four times a day (QID) | INTRAMUSCULAR | Status: DC | PRN
  Administered 2014-04-01: 6.25 mg via INTRAVENOUS
  Filled 2014-04-01: qty 1

## 2014-04-01 MED ORDER — SCOPOLAMINE 1 MG/3DAYS TD PT72
1.0000 | MEDICATED_PATCH | TRANSDERMAL | Status: DC
Start: 2014-04-01 — End: 2014-04-03
  Administered 2014-04-01: 1.5 mg via TRANSDERMAL
  Filled 2014-04-01: qty 1

## 2014-04-01 MED ORDER — ONDANSETRON HCL 4 MG/2ML IJ SOLN
4.0000 mg | INTRAMUSCULAR | Status: AC
  Administered 2014-04-01: 4 mg via INTRAVENOUS

## 2014-04-01 MED ORDER — IBUPROFEN 600 MG PO TABS
600.0000 mg | ORAL_TABLET | Freq: Three times a day (TID) | ORAL | Status: DC
  Administered 2014-04-02 – 2014-04-03 (×5): 600 mg via ORAL
  Filled 2014-04-01 (×7): qty 1

## 2014-04-01 NOTE — Care Management Note (Signed)
    Page 1 of 1   04/01/2014     11:52:43 AM CARE MANAGEMENT NOTE 04/01/2014  Patient:  Stacy Buck, Stacy Buck   Account Number:  [id number]  Date Initiated:  04/01/2014  Documentation initiated by:  Sunday Spillers  Subjective/Objective Assessment:   33 yo female admitted s/p open radical hysterectomy. PTA lived at home with spouse.     Action/Plan:   Home when stable   Anticipated DC Date:  04/04/2014   Anticipated DC Plan:  [place]  CM consult      Choice offered to / List presented to:             Status of service:  Completed, signed off Medicare Important Message given?   (If response is "NO", the following Medicare IM given date fields will be blank) Date Medicare IM given:   Date Additional Medicare IM given:    Discharge Disposition:  HOME/SELF CARE  Per UR Regulation:  Reviewed for med. necessity/level of care/duration of stay  If discussed at [place] of Stay Meetings, dates discussed:    Comments:

## 2014-04-01 NOTE — Progress Notes (Signed)
1 Day Post-Op Procedure(s) (LRB): EXPLORATORY LAPAROTOMY/RADICAL HYSTERECTOMY/LYMPHNODE DISECTION (N/A)  Subjective: Patient reports intermittent nausea with no emesis when attempting to get out of bed.  Reporting moderate pain at times but states she does not want to take dilaudid IV for pain again because it made her sick.  Denies chest pain, dyspnea, passing flatus, or having a bowel movement.    Objective: Vital signs in last 24 hours: Temp:  [97.7 F (36.5 C)-98.3 F (36.8 C)] 98.3 F (36.8 C) (04/22 1036) Pulse Rate:  [62-94] 75 (04/22 1036) Resp:  [12-20] 18 (04/22 1036) BP: (94-128)/(53-65) 101/61 mmHg (04/22 1036) SpO2:  [93 %-100 %] 100 % (04/22 1036) Weight:  [124 lb 8 oz (56.473 kg)] 124 lb 8 oz (56.473 kg) (04/21 1545)    Intake/Output from previous day: 04/21 0701 - 04/22 0700 In: 3300 [P.O.:600; I.V.:2700] Out: 1285 [Urine:1210; Blood:75]  Physical Examination: General: alert, cooperative and no distress Resp: clear to auscultation bilaterally Cardio: regular rate and rhythm, S1, S2 normal, no murmur, click, rub or gallop GI: incision: low transverse incision with steri strips clean and intact, no drainage or bleeding noted, dressing around suprapubic dry and intact and abdomen soft, hypoactive bowel sounds Extremities: extremities normal, atraumatic, no cyanosis or edema  Labs:   BUN/Cr/glu/ALT/AST/amyl/lip:  9/0.67/--/--/--/--/-- (04/22 0439)  Assessment: 33 y.o. s/p Procedure(s): EXPLORATORY LAPAROTOMY/RADICAL HYSTERECTOMY/LYMPHNODE DISECTION: stable Pain:  Pain is well-controlled on PRN medications.  CV: BP and HR stable post-operatively.  Continue to monitor.  GI:  Tolerating po: minimal amount due to nausea.  Zofran ordered.  GU:  Suprapubic draining clear, yellow urine.  Foley to be removed today.  FEN:  Stable post-operatively.  Endo: Random glucose this am 200.  Plan to repeat in the am.  Prophylaxis: intermittent pneumatic compression  boots.  Plan: Saline lock IV when nausea resolves Discontinue foley Bmet in the am Encourage ambulation, IS use, deep breathing, and coughing Rocking chair to the room Continue post-operative plan of care per Dr. Delsa Sale   LOS: 1 day    Dorothyann Gibbs 04/01/2014, 11:37 AM

## 2014-04-02 LAB — BASIC METABOLIC PANEL
BUN: 10 mg/dL (ref 6–23)
CALCIUM: 8 mg/dL — AB (ref 8.4–10.5)
CO2: 25 meq/L (ref 19–32)
Chloride: 108 mEq/L (ref 96–112)
Creatinine, Ser: 0.71 mg/dL (ref 0.50–1.10)
GFR calc Af Amer: >90 mL/min (ref >90)
GFR calc non Af Amer: >90 mL/min (ref >90)
GLUCOSE: 96 mg/dL (ref 70–99)
Potassium: 4.4 mEq/L (ref 3.7–5.3)
Sodium: 141 mEq/L (ref 137–147)

## 2014-04-02 LAB — CBC
HCT: 26.1 % — ABNORMAL LOW (ref 36.0–46.0)
Hemoglobin: 8.8 g/dL — ABNORMAL LOW (ref 12.0–15.0)
MCH: 30.3 pg (ref 26.0–34.0)
MCHC: 33.7 g/dL (ref 30.0–36.0)
MCV: 90 fL (ref 78.0–100.0)
PLATELETS: 202 10*3/uL (ref 150–400)
RBC: 2.9 MIL/uL — ABNORMAL LOW (ref 3.87–5.11)
RDW: 13.7 % (ref 11.5–15.5)
WBC: 11.7 10*3/uL — ABNORMAL HIGH (ref 4.0–10.5)

## 2014-04-02 NOTE — Progress Notes (Signed)
Pt up to shower.  Pt concerned about 2 quarter-sized blood spots on towel from vagina.

## 2014-04-02 NOTE — Progress Notes (Signed)
2 Days Post-Op Procedure(s) (LRB): EXPLORATORY LAPAROTOMY/RADICAL HYSTERECTOMY/LYMPHNODE DISECTION (N/A)  Subjective: Patient reports feeling better.  Nausea has improved with no emesis.  Passing flatus and having bowel movements.  Increasing ambulation.  Minimal pain reported.  Denies chest pain, dyspnea.  No concerns voiced.    Objective: Vital signs in last 24 hours: Temp:  [97.7 F (36.5 C)-98.3 F (36.8 C)] 98.2 F (36.8 C) (04/23 0444) Pulse Rate:  [75-89] 77 (04/23 0444) Resp:  [16-18] 18 (04/23 0444) BP: (94-105)/(58-65) 104/58 mmHg (04/23 0444) SpO2:  [93 %-100 %] 93 % (04/23 0444) Last BM Date: 04/01/14  Intake/Output from previous day: 04/22 0701 - 04/23 0700 In: 2280 [P.O.:480; I.V.:1800] Out: 1602 [Urine:1600; Stool:2]  Physical Examination: General: alert, cooperative and no distress Resp: clear to auscultation bilaterally Cardio: regular rate and rhythm, S1, S2 normal, no murmur, click, rub or gallop GI: incision: low transverse incision with steri strips clean and intact, no drainage or bleeding noted, dressing around suprapubic dry and intact and abdomen soft, active bowel sounds Extremities: extremities normal, atraumatic, no cyanosis or edema  Labs: WBC/Hgb/Hct/Plts:  11.7/8.8/26.1/202 (04/23 2423) BUN/Cr/glu/ALT/AST/amyl/lip:  10/0.71/--/--/--/--/-- (04/23 0429)  Assessment: 33 y.o. s/p Procedure(s): EXPLORATORY LAPAROTOMY/RADICAL HYSTERECTOMY/LYMPHNODE DISECTION: stable Pain:  Pain is well-controlled on PRN medications.  CV: BP and HR stable post-operatively.  Continue to monitor.  GI:  Tolerating po: Yes, nausea improved.  GU:  Suprapubic draining clear, yellow urine.    FEN:  Stable post-operatively.  Endo: Random glucose this am 96.    Prophylaxis: intermittent pneumatic compression boots.  Plan: Saline lock IV  Encourage ambulation, IS use, deep breathing, and coughing Rocking chair to the room Continue post-operative plan of care per Dr.  Delsa Sale   LOS: 2 days    Dorothyann Gibbs 04/02/2014, 10:13 AM

## 2014-04-03 MED ORDER — OXYCODONE-ACETAMINOPHEN 5-325 MG PO TABS: 1.0000 | ORAL_TABLET | ORAL | Status: DC | PRN

## 2014-04-03 NOTE — Discharge Summary (Signed)
Physician Discharge Summary  Patient ID: Stacy Buck MRN: [id number] DOB/AGE: 09/07/1981 33 y.o.  Admit date: 03/31/2014 Discharge date: 04/03/2014  Admission Diagnoses: Cervical cancer  Discharge Diagnoses:  Principal Problem:   Cervical cancer   Discharged Condition:  The patient is in good condition and stable for discharge.    Hospital Course: On 03/31/2014, the patient underwent the following: Procedure(s):  EXPLORATORY LAPAROTOMY/RADICAL HYSTERECTOMY/LYMPHNODE DISECTION.  The postoperative course was uneventful.  She was discharged to home on postoperative day 3 tolerating a regular diet, passing flatus, and having bowel movements.  Suprapubic catheter care discussed and demonstrated.  She is to return to the office next week for begin suprapubic catheter training.  Consults: None  Significant Diagnostic Studies: None  Treatments: surgery: see above  Discharge Exam: Blood pressure 123/73, pulse 74, temperature 98.9 F (37.2 C), temperature source Oral, resp. rate 16, height 5' 1.5" (1.562 m), weight 124 lb 8 oz (56.473 kg), last menstrual period 03/31/2014, SpO2 96.00%. General appearance: alert, cooperative and no distress Resp: clear to auscultation bilaterally Cardio: regular rate and rhythm, S1, S2 normal, no murmur, click, rub or gallop GI: soft, non-tender; bowel sounds normal; no masses,  no organomegaly Extremities: extremities normal, atraumatic, no cyanosis or edema Incision/Wound: Low transverse incision with steri strips clean and intact with no drainage or erythema.  Suprapubic insertion site with no drainage or erythema.  Dressing changed.  Disposition: Home      Discharge Orders   Future Appointments Provider Department Dept Phone   04/08/2014 10:45 AM Dorothyann Gibbs, NP [facility] [phone]   04/24/2014 3:45 PM Alvino Chapel, MD [facility] [phone]   Future  Orders Complete By Expires   Call MD for:  difficulty breathing, headache or visual disturbances  As directed    Call MD for:  extreme fatigue  As directed    Call MD for:  hives  As directed    Call MD for:  persistant dizziness or light-headedness  As directed    Call MD for:  persistant nausea and vomiting  As directed    Call MD for:  redness, tenderness, or signs of infection (pain, swelling, redness, odor or green/yellow discharge around incision site)  As directed    Call MD for:  severe uncontrolled pain  As directed    Call MD for:  temperature >100.4  As directed    Diet - low sodium heart healthy  As directed    Discharge instructions  As directed    Driving Restrictions  As directed    Increase activity slowly  As directed    Lifting restrictions  As directed    Sexual Activity Restrictions  As directed        Medication List         acetaminophen 500 MG tablet  Commonly known as:  TYLENOL  Take 500 mg by mouth once.     ALPRAZolam 0.5 MG tablet  Commonly known as:  XANAX  Take 0.25 mg by mouth 2 (two) times daily as needed for anxiety.     calcium carbonate 500 MG chewable tablet  Commonly known as:  TUMS - dosed in mg elemental calcium  Chew 1 tablet by mouth daily.     ibuprofen 200 MG tablet  Commonly known as:  ADVIL,MOTRIN  Take 200-400 mg by mouth every 6 (six) hours as needed for mild pain or moderate pain.     multivitamin with minerals Tabs tablet  Take 1 tablet by mouth daily.     oxyCODONE-acetaminophen 5-325 MG per tablet  Commonly known as:  PERCOCET/ROXICET  Take 1-2 tablets by mouth every 4 (four) hours as needed for severe pain.     venlafaxine XR 75 MG 24 hr capsule  Commonly known as:  EFFEXOR-XR  Take 75 mg by mouth daily with breakfast.       Follow-up Information   Follow up with Ezella Kell DEAL, NP On 04/08/2014. (at 10:45 am at the [facility] for suprapubic catheter bladder training)    Specialty:  Gynecologic Oncology    Contact information:   [place] [address] [phone]       Follow up with Alvino Chapel, MD On 04/24/2014. (at 3:45 pm.  Arrive at 3pm at the [facility])    Specialty:  Gynecology   Contact information:   [address] [place] [address] [phone]       Greater than thirty minutes were spend for face to face discharge instructions and discharge orders/summary in EPIC.   Signed: Dorothyann Gibbs 04/03/2014, 11:57 AM

## 2014-04-03 NOTE — Discharge Instructions (Signed)
04/03/2014  Return to work: 4-6 weeks  Activity: 1. Be up and out of the bed during the day.  Take a nap if needed.  You may walk up steps but be careful and use the hand rail.  Stair climbing will tire you more than you think, you may need to stop part way and rest.   2. No lifting or straining for 6 weeks.  3. No driving for 2 weeks.  Do not drive if you are taking narcotic pain medicine.  4. Shower daily.  Use soap and water on your incision and pat dry; don't rub.   5. No sexual activity and nothing in the vagina for 6 weeks.  Diet: 1. Low sodium Heart Healthy Diet is recommended.  2. It is safe to use a laxative if you have difficulty moving your bowels.  You may use Miralax or a stool softener.   Wound Care: 1. Keep clean and dry.  Shower daily.  Reasons to call the Doctor:  Fever - Oral temperature greater than 100.4 degrees Fahrenheit  Foul-smelling vaginal discharge  Difficulty urinating  Nausea and vomiting  Increased pain at the site of the incision that is unrelieved with pain medicine.  Difficulty breathing with or without chest pain  New calf pain especially if only on one side  Sudden, continuing increased vaginal bleeding with or without clots.   Contacts: For questions or concerns you should contact:  Dr. Lahoma Crocker at [phone]  Dr. Fermin Schwab at [place]  Joylene John, NP at [phone]  Suprapubic Catheter Home Guide  A suprapubic catheter is a rubber tube with a tiny balloon on the end. It is used to drain urine from the bladder. This catheter is put in your bladder through a small opening in the lower center part of your abdomen. Suprapubic refers to the area right above your pubic bone. The balloon on the end of the catheter is filled with germ-free (sterile) water. This keeps the catheter from slipping out. When the catheter is in place, your urine will drain into a collection bag. The bag can be put beside your  bed at night or attached to your leg during the day. HOW TO CARE FOR YOUR CATHETER  Cleaning your skin  Clean the skin around the catheter opening every day.  Wash your hands with soap and water.  Clean the skin around the opening with a clean washcloth and soapy water. Do not pull on the tube.  Pat the area dry with a clean towel.  Your caregiver may want you to put a bandage (dressing) over the site. Do not use ointment on this area unless your caregiver tells you to. Cleaning the catheter  Ask your caregiver if you need to clean the catheter and how often.  Use only soap and water.  There may be crusts on the catheter. Put hydrogen peroxide on a cotton ball or gauze pad to remove any crust. Emptying the collection bag  You may have a large drainage bag to use at night and a smaller one for daytime. Empty the large bag every 8 hours. Empty the small bag when it is about  full.  Keep the drainage bag below the level of the catheter. This keeps urine from flowing backwards.  Hold the bag over the toilet or another container. Release the valve (spigot) at the bottom of the bag. Do not touch the opening of the spigot. Do not let the opening touch the toilet or container.  Close  the spigot tightly when the bag is empty. Cleaning the collection bag  Clean the bag every few days.  First, wash your hands.  Disconnect the tubing from the catheter. Replace the used bag with a new bag. Then you can clean the used one.  Empty the used bag completely. Rinse it out with warm water and soap or fill the bag with water and add 1 teaspoon of vinegar. Let it sit for about 30 minutes. Then drain.  The bag should be completely dry before storing it. Put it inside a plastic bag to keep it clean. Checking everything  Always make sure there are no kinks in the catheter or tubing.  Always make sure there are no leaks in the catheter, tubing, or collection bag. HOW TO CHANGE YOUR  CATHETER Sometimes, a caregiver will change your suprapubic catheter. Other times, you may need to change it yourself. This may be the case if you need to wear a catheter for a long time. Usually, they need to be changed every 4 to 6 weeks. Ask your caregiver how often yours should be changed. Your caregiver will help you order the following supplies for home delivery:  Sterile gloves.  Catheters.  Syringes.  Sterile water.  Sterile cleaning solution.  Lubricant.  Drainage bags. Changing your catheter  Drink plenty of fluids before changing the catheter.  Wash your hands with soap and water.  Lie on your back and put on sterile gloves.  Clean the skin around the catheter opening. Use the sterile cleaning solution.  Use a syringe to get the water out of the balloon from the old catheter.  Slowly remove the catheter.  Take off the first pair of gloves, and put on a new pair. Then put lubricant on the tip of the new catheter. Put the new catheter through the opening.  Wait for some urine to start flowing. Then, use the other syringe to fill the balloon with sterile water.  Attach the catheter to your drainage bag. Make sure the connection is tight. Important warnings  The catheter should come out easily. If it seems stuck, do not pull it.  Call your caregiver right away if you have any trouble while changing the catheter.  When the old catheter is removed, the new one should be put in right away. This is because the opening will close quickly. If you have a problem, go to an emergency clinic right away. RISKS AND COMPLICATIONS  Urine flow can become blocked. This can happen if the catheter or tubes are not working right. A blood clot can also block urine flow.  The catheter might irritate tissue in your body. This can cause bleeding.  The skin near the opening for the catheter may become irritated or infected.  Bacteria may get into your bladder. This can cause a  urinary tract infection. HOME CARE INSTRUCTIONS  Take all medicines prescribed by your caregiver. Follow the directions carefully.  Drink 8 glasses of water every day. This produces good urine flow.  Check the skin around your catheter a few times every day. Watch for redness and swelling. Look for any fluids coming out of the opening.  Do not use powder or cream around the catheter opening.  Do not take tub baths or use pools or hot tubs.  Keep all follow-up appointments. SEEK MEDICAL CARE IF:  You leak urine.  Your skin around the catheter becomes red or sore.  Your urine flow slows down.  Your urine gets cloudy  or smelly. SEEK IMMEDIATE MEDICAL CARE IF:   You have chills, nausea, or back pain.  You have trouble changing your catheter.  Your catheter comes out.  You have blood in your urine.  You have no urine flow for 1 hour.  You have a fever. Document Released: 08/15/2011 Document Revised: 02/19/2012 Document Reviewed: 08/15/2011 [facility] Patient Information 2014 [place].

## 2014-04-06 ENCOUNTER — Telehealth: Payer: Self-pay | Admitting: *Deleted

## 2014-04-06 NOTE — Telephone Encounter (Signed)
Called pt to check post op status, pt unavailable, lmovm to call office.

## 2014-04-06 NOTE — Telephone Encounter (Signed)
Called pt to check post op status. Pt complained of gas, mild back pain. Discussed with pt the importance of getting up and moving around, walking and posture while sitting/laying, this will help with gas pain. Pt complained of mild back pain, again discussed importance of posture while walking and sitting. Pt verbalized she should be walking more and will be here on Wednesday for bladder training.

## 2014-04-08 ENCOUNTER — Emergency Department (HOSPITAL_BASED_OUTPATIENT_CLINIC_OR_DEPARTMENT_OTHER)
Admission: EM | Admit: 2014-04-08 | Discharge: 2014-04-09 | Disposition: A | Discharge status: Home / Self Care | Payer: BC Managed Care – PPO | Attending: Emergency Medicine | Admitting: Emergency Medicine

## 2014-04-08 ENCOUNTER — Ambulatory Visit: Payer: BC Managed Care – PPO | Attending: Gynecologic Oncology | Admitting: Gynecologic Oncology

## 2014-04-08 ENCOUNTER — Encounter (HOSPITAL_BASED_OUTPATIENT_CLINIC_OR_DEPARTMENT_OTHER): Payer: Self-pay | Admitting: Emergency Medicine

## 2014-04-08 ENCOUNTER — Encounter: Payer: Self-pay | Admitting: Gynecologic Oncology

## 2014-04-08 VITALS — BP 117/72 | HR 97 | Temp 98.7°F | Resp 20 | Wt 123.2 lb

## 2014-04-08 DIAGNOSIS — Z872 Personal history of diseases of the skin and subcutaneous tissue: Secondary | ICD-10-CM | POA: Insufficient documentation

## 2014-04-08 DIAGNOSIS — N390 Urinary tract infection, site not specified: Secondary | ICD-10-CM | POA: Insufficient documentation

## 2014-04-08 DIAGNOSIS — F411 Generalized anxiety disorder: Secondary | ICD-10-CM | POA: Insufficient documentation

## 2014-04-08 DIAGNOSIS — T8140XA Infection following a procedure, unspecified, initial encounter: Secondary | ICD-10-CM | POA: Insufficient documentation

## 2014-04-08 DIAGNOSIS — Y846 Urinary catheterization as the cause of abnormal reaction of the patient, or of later complication, without mention of misadventure at the time of the procedure: Secondary | ICD-10-CM | POA: Insufficient documentation

## 2014-04-08 DIAGNOSIS — C539 Malignant neoplasm of cervix uteri, unspecified: Secondary | ICD-10-CM

## 2014-04-08 DIAGNOSIS — F17200 Nicotine dependence, unspecified, uncomplicated: Secondary | ICD-10-CM | POA: Insufficient documentation

## 2014-04-08 DIAGNOSIS — Z435 Encounter for attention to cystostomy: Secondary | ICD-10-CM

## 2014-04-08 DIAGNOSIS — Z79899 Other long term (current) drug therapy: Secondary | ICD-10-CM | POA: Insufficient documentation

## 2014-04-08 DIAGNOSIS — Z8541 Personal history of malignant neoplasm of cervix uteri: Secondary | ICD-10-CM | POA: Insufficient documentation

## 2014-04-08 LAB — URINALYSIS, ROUTINE W REFLEX MICROSCOPIC
Bilirubin Urine: NEGATIVE
GLUCOSE, UA: NEGATIVE mg/dL
Ketones, ur: NEGATIVE mg/dL
Nitrite: NEGATIVE
PH: 6.5 (ref 5.0–8.0)
Protein, ur: 100 mg/dL — AB
SPECIFIC GRAVITY, URINE: 1.022 (ref 1.005–1.030)
Urobilinogen, UA: 1 mg/dL (ref 0.0–1.0)

## 2014-04-08 LAB — URINE MICROSCOPIC-ADD ON

## 2014-04-08 NOTE — ED Notes (Signed)
Pt c/o drainage around super pubic cath x 1 day with redness noted

## 2014-04-08 NOTE — Progress Notes (Signed)
Follow Up Note: Gyn-Onc  Stacy Buck 33 y.o. female  CC:  Chief Complaint  Patient presents with  . Suprapubic Catheter Training    Follow up post-op    HPI:  Stacy Buck is a 33 year old, gravida 2 para 2, seen in consultation at the request of Dr. Christophe Louis regarding management of a newly diagnosed cervical carcinoma. Patient believes she had her last Pap smear in 2011. Recently she had a Pap smear showing atypical squamous cells and high-grade dysplasia cannot be ruled out.  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  In retrospect, the patient reports having postcoital bleeding for several months. She denies any pelvic pain or any GI or GU symptoms. Functional status is excellent.  She had a CT scan obtained on Orabel[place] 7 that shows no evidence of metastatic disease.    On 03/31/14, she underwent a radical abdominal hysterectomy, bilateral salpingectomy, bilateral pelvic lymphadenectomy, placement of suprapubic catheter, bilateral oophorpexy by Dr. Fermin Schwab.  Her post-operative course was uneventful.  Final pathology revealed:  1. 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 - VAGINAL RESECTION MARGIN, NEGATIVE FOR DYSPLASIA OR MALIGNANCY. - PARAMETRIAL TISSUE: FIVE LYMPH NODES AND BENIGN ADIPOSE TISSUE, NO EVIDENCE OF MALIGNANCY, (0/5). 2. Lymph nodes, regional resection, left pelvic - EIGHT LYMPH NODES, NEGATIVE FOR METASTATIC CARCINOMA (0/8). 3. Lymph nodes, regional resection, right pelvic - SIX LYMPH NODES, NEGATIVE FOR METASTATIC CARCINOMA (0/6)    Interval History:  She presents today with two friends for post-operative follow up and for suprapubic catheter bladder training.  She reports doing well since surgery.  She describes expected post operative status.  Adequate PO intake reported.   Bowels and bladder functioning without difficulty.  Pain minimal with adequate pain control with ibuprofen use.  "I did not even have to get the percocet filled when I left the hospital."  Adequate urine output reported from the suprapubic catheter at home.  Drinking adequate amounts.  She has been showering once or twice daily and has been placing a clean dressing over the catheter insertion.  No bleeding or drainage reported from the catheter insertion site to the abdomen or from the vagina.  Voicing concerns about radiation recommended by Dr. Fermin Schwab and symptoms that may occur.    Review of Systems Constitutional: Feels well.  No fever, chills, early satiety, change in appetite, or unintentional weight loss or gain. Cardiovascular: No chest pain, shortness of breath, or edema.  Pulmonary: No cough or wheeze.  Gastrointestinal: No nausea, vomiting, or diarrhea. No bright red blood per rectum or change in bowel movement.  Genitourinary: No frequency, urgency, or dysuria. No vaginal bleeding or discharge.  Musculoskeletal: No myalgia or joint pain. Neurologic: No weakness, numbness, or change in gait.  Psychology: No depression, anxiety, or insomnia.  Current Meds:  Outpatient Encounter Prescriptions as of 04/08/2014  Medication Sig  . ibuprofen (ADVIL,MOTRIN) 200 MG tablet Take 200-400 mg by mouth every 6 (six) hours as needed for mild pain or moderate pain.  Marland Kitchen venlafaxine XR (EFFEXOR-XR) 75 MG 24 hr capsule Take 75 mg by mouth daily with breakfast.  . acetaminophen (TYLENOL) 500 MG tablet Take 500 mg by mouth once.  . ALPRAZolam (XANAX) 0.5 MG tablet Take 0.25 mg by mouth 2 (two) times daily as needed for anxiety.   . calcium carbonate (TUMS - DOSED IN MG ELEMENTAL CALCIUM)  500 MG chewable tablet Chew 1 tablet by mouth daily.  . Multiple Vitamin (MULTIVITAMIN WITH MINERALS) TABS tablet Take 1 tablet by mouth daily.  Marland Kitchen oxyCODONE-acetaminophen (PERCOCET/ROXICET) 5-325 MG per tablet Take  1-2 tablets by mouth every 4 (four) hours as needed for severe pain.    Allergy: No Known Allergies  Social Hx:   History   Social History  . Marital Status: Married    Spouse Name: N/A    Number of Children: 2  . 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 Smoking status: Current Every Day Smoker -- 0.50 packs/day for 15 years    Types: Cigarettes  . Smokeless tobacco: Not on file     Comment: cut back to 5 cigs per day  . Alcohol Use: No  . Drug Use: No  . Sexual Activity: Yes   Other Topics Concern  . Not on file   Social History Narrative  . No narrative on file    Past Surgical Hx:  Past Surgical History  Procedure Laterality Date  . Childbirth      x2 NVD- epidural anesthesia  . Radical hysterectomy N/A 03/31/2014    Procedure: EXPLORATORY LAPAROTOMY/RADICAL HYSTERECTOMY/LYMPHNODE DISECTION;  Surgeon: Alvino Chapel, MD;  Location: [facility];  Service: Gynecology;  Laterality: N/A;    Past Medical Hx:  Past Medical History  Diagnosis Date  . Cancer 03/11/2014    cervical cancer  . Anxiety   . Psoriasis     patches on elbows, knees, legs ,abdomen, buttocks.     Family Hx:  Family History  Problem Relation Age of Onset  . Diabetes Father   . Coronary artery disease Father   . Stroke Maternal Grandfather   . Diabetes Paternal Grandmother     Vitals:  Blood pressure 117/72, pulse 97, temperature 98.7 F (37.1 C), temperature source Oral, resp. rate 20, weight 123 lb 3.2 oz (55.883 kg), last menstrual period 03/31/2014.  Physical Exam:  General: Well developed, well nourished female in no acute distress. Alert and oriented x 3.  Cardiovascular: Regular rate and rhythm. S1 and S2 normal.  Lungs: Clear to auscultation bilaterally. No wheezes/crackles/rhonchi noted.  Skin: No rashes or lesions present. Back: No CVA tenderness.  Abdomen: Abdomen soft, non-tender and non-obese.  Active bowel sounds in all quadrants.  Low transverse incision with steri strips with no drainage or erythema.  Remaining steri strips removed without difficulty.  Dressing removed from around suprapubic catheter insertion site.  Incision clean and dry with no erythema or drainage.  Healing ecchymosis noted to lateral aspects of the lower abdomen.  New dressing applied.  Suprapubic catheter disconnected from leg bag with clear, yellow urine and plugged off to begin bladder training.     Extremities: No bilateral cyanosis, edema, or clubbing.   Assessment/Plan:  33 year old female s/p radical abdominal hysterectomy, bilateral salpingectomy, bilateral pelvic lymphadenectomy, placement of suprapubic catheter, bilateral oophorpexy on 03/31/14 by Dr. 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  Due to the intermediate risk features (size of 4.5 cm, depth of invasion of 21 mm in depth with cervical thickness of 28 mm, and LVSI), whole pelvis radiation is recommended by Dr. Fermin Schwab.  She is doing well post-operatively.  She is to see Dr. Fermin Schwab on May 15 for post-operative follow up and then a referral to Dr. Sondra Come in Radiation Oncology  will be made.  She is to begin suprapubic catheter bladder training today.  She is aware her catheter was plugged/clamped at 11:20 and she would need to attempt to void in two hours or sooner if the urge develops.  Bladder training discussed and patient verbalized understanding with return demonstration.  She is to call with an update in several days and to call for any questions or concerns.  Reportable signs and symptoms reviewed.         Dorothyann Gibbs, NP 04/08/2014, 2:36 PM

## 2014-04-08 NOTE — Patient Instructions (Signed)
Doing well.  Please call every couple days with an update on your bladder training.    Suprapubic Catheter Home Guide A suprapubic catheter is a rubber tube with a tiny balloon on the end. It is used to drain urine from the bladder. This catheter is put in your bladder through a small opening in the lower center part of your abdomen. Suprapubic refers to the area right above your pubic bone. The balloon on the end of the catheter is filled with germ-free (sterile) water. This keeps the catheter from slipping out. When the catheter is in place, your urine will drain into a collection bag. The bag can be put beside your bed at night or attached to your leg during the day. HOW TO CARE FOR YOUR CATHETER  Cleaning your skin  Clean the skin around the catheter opening every day.  Wash your hands with soap and water.  Clean the skin around the opening with a clean washcloth and soapy water. Do not pull on the tube.  Pat the area dry with a clean towel.  Your caregiver may want you to put a bandage (dressing) over the site. Do not use ointment on this area unless your caregiver tells you to. Cleaning the catheter  Ask your caregiver if you need to clean the catheter and how often.  Use only soap and water.  There may be crusts on the catheter. Put hydrogen peroxide on a cotton ball or gauze pad to remove any crust. Emptying the collection bag  You may have a large drainage bag to use at night and a smaller one for daytime. Empty the large bag every 8 hours. Empty the small bag when it is about  full.  Keep the drainage bag below the level of the catheter. This keeps urine from flowing backwards.  Hold the bag over the toilet or another container. Release the valve (spigot) at the bottom of the bag. Do not touch the opening of the spigot. Do not let the opening touch the toilet or container.  Close the spigot tightly when the bag is empty. Cleaning the collection bag  Clean the bag every  few days.  First, wash your hands.  Disconnect the tubing from the catheter. Replace the used bag with a new bag. Then you can clean the used one.  Empty the used bag completely. Rinse it out with warm water and soap or fill the bag with water and add 1 teaspoon of vinegar. Let it sit for about 30 minutes. Then drain.  The bag should be completely dry before storing it. Put it inside a plastic bag to keep it clean. Checking everything  Always make sure there are no kinks in the catheter or tubing.  Always make sure there are no leaks in the catheter, tubing, or collection bag.  SEEK MEDICAL CARE IF:  You leak urine.  Your skin around the catheter becomes red or sore.  Your urine flow slows down.  Your urine gets cloudy or smelly. SEEK IMMEDIATE MEDICAL CARE IF:   You have chills, nausea, or back pain.  You have trouble changing your catheter.  Your catheter comes out.  You have blood in your urine.  You have no urine flow for 1 hour.  You have a fever. Document Released: 08/15/2011 Document Revised: 02/19/2012 Document Reviewed: 08/15/2011 [facility] Patient Information 2014 [place].

## 2014-04-08 NOTE — ED Provider Notes (Signed)
CSN: [id number]     Arrival date & time 04/08/14  2240 History  This chart was scribed for Stacy Fines, MD by Einar Pheasant, ED Scribe. This patient was seen in room MH05/MH05 and the patient's care was started at 11:35 PM.    Chief Complaint  Patient presents with  . Wound Check   The history is provided by the patient. No language interpreter was used.   HPI Comments: Stacy Buck is a 33 y.o. female with a history of cervical cancer presents to the Emergency Department complaining of a problem with her suprapubic catheter. Pt states that she had a partial hysterectomy done 2 weeks ago. She states that she was at the doctor for a follow up this morning an everything was fine. Her suprapubic catheter was stoppered shut with the intent of her relearning to void naturally. When she returned home she noticed a red ring around her catheter that was not present earlier. Pt states that she is unable to void normally, and has had to void via the catheter. She gets the feeling of her bladder being full. She noted some yellowish drainage around the catheter earlier. She denies fever, chills or abdominal pain except some suprapubic soreness from the surgery.  Past Medical History  Diagnosis Date  . Cancer 03/11/2014    cervical cancer  . Anxiety   . Psoriasis     patches on elbows, knees, legs ,abdomen, buttocks.    Past Surgical History  Procedure Laterality Date  . Childbirth      x2 NVD- epidural anesthesia  . Radical hysterectomy N/A 03/31/2014    Procedure: EXPLORATORY LAPAROTOMY/RADICAL HYSTERECTOMY/LYMPHNODE DISECTION;  Surgeon: Alvino Chapel, MD;  Location: [facility];  Service: Gynecology;  Laterality: N/A;   Family History  Problem Relation Age of Onset  . Diabetes Father   . Coronary artery disease Father   . Stroke Maternal Grandfather   . Diabetes Paternal Grandmother    History  Substance Use Topics  . Smoking status: Current Every Day Smoker -- 0.50 packs/day for 15  years    Types: Cigarettes  . Smokeless tobacco: Not on file     Comment: cut back to 5 cigs per day  . Alcohol Use: No   OB History   Grav Para Term Preterm Abortions TAB SAB Ect Mult Living                 Review of Systems A complete 10 system review of systems was obtained and all systems are negative except as noted in the HPI and PMH.    Allergies  Review of patient's allergies indicates no known allergies.  Home Medications   Prior to Admission medications   Medication Sig Start Date End Date Taking? Authorizing Provider  acetaminophen (TYLENOL) 500 MG tablet Take 500 mg by mouth once.    Historical Provider, MD  ALPRAZolam Duanne Moron) 0.5 MG tablet Take 0.25 mg by mouth 2 (two) times daily as needed for anxiety.  03/11/14   Historical Provider, MD  calcium carbonate (TUMS - DOSED IN MG ELEMENTAL CALCIUM) 500 MG chewable tablet Chew 1 tablet by mouth daily.    Historical Provider, MD  ibuprofen (ADVIL,MOTRIN) 200 MG tablet Take 200-400 mg by mouth every 6 (six) hours as needed for mild pain or moderate pain.    Historical Provider, MD  Multiple Vitamin (MULTIVITAMIN WITH MINERALS) TABS tablet Take 1 tablet by mouth daily.    Historical Provider, MD  oxyCODONE-acetaminophen (PERCOCET/ROXICET) 5-325 MG per tablet  Take 1-2 tablets by mouth every 4 (four) hours as needed for severe pain. 04/03/14   Dorothyann Gibbs, NP  venlafaxine XR (EFFEXOR-XR) 75 MG 24 hr capsule Take 75 mg by mouth daily with breakfast. 02/10/14   Historical Provider, MD   BP 119/68  Pulse 100  Temp(Src) 98.6 F (37 C) (Oral)  Resp 18  Ht 5\' 1"  (1.549 m)  Wt 124 lb (56.246 kg)  BMI 23.44 kg/m2  SpO2 100%  LMP 03/31/2014  Physical Exam  Nursing note and vitals reviewed. General: Well-developed, well-nourished female in no acute distress; appearance consistent with age of record HENT: normocephalic; atraumatic Eyes: pupils equal, round and reactive to light; extraocular muscles intact Neck: supple Heart:  regular rate and rhythm; no murmurs, rubs or gallops Lungs: clear to auscultation bilaterally Abdomen: soft; nondistended; mild suprapubic tenderness with ecchymosis; no masses or hepatosplenomegaly; bowel sounds present Extremities: No deformity; full range of motion; pulses normal Neurologic: Awake, alert and oriented; motor function intact in all extremities and symmetric; no facial droop Skin: Warm and dry Psychiatric: Normal mood and affect GU: Suprapubic catheter sutured in place with about a 1.5 diameter surrounding erythema without significant tenderness. No purulent drainage noted.   ED Course  Procedures (including critical care time)  DIAGNOSTIC STUDIES: Oxygen Saturation is 100% on RA, normal by my interpretation.    COORDINATION OF CARE: 11:40 PM- Pt advised of plan for treatment and pt agrees.  MDM   Nursing notes and vitals signs, including pulse oximetry, reviewed.  Summary of this visit's results, reviewed by myself:  Labs:  Results for orders placed during the hospital encounter of 04/08/14 (from the past 24 hour(s))  URINALYSIS, ROUTINE W REFLEX MICROSCOPIC     Status: Abnormal   Collection Time    04/08/14 11:25 PM      Result Value Ref Range   Color, Urine RED (*) YELLOW   APPearance CLOUDY (*) CLEAR   Specific Gravity, Urine 1.022  1.005 - 1.030   pH 6.5  5.0 - 8.0   Glucose, UA NEGATIVE  NEGATIVE mg/dL   Hgb urine dipstick LARGE (*) NEGATIVE   Bilirubin Urine NEGATIVE  NEGATIVE   Ketones, ur NEGATIVE  NEGATIVE mg/dL   Protein, ur 100 (*) NEGATIVE mg/dL   Urobilinogen, UA 1.0  0.0 - 1.0 mg/dL   Nitrite NEGATIVE  NEGATIVE   Leukocytes, UA MODERATE (*) NEGATIVE  URINE MICROSCOPIC-ADD ON     Status: Abnormal   Collection Time    04/08/14 11:25 PM      Result Value Ref Range   Squamous Epithelial / LPF FEW (*) RARE   WBC, UA 3-6  <3 WBC/hpf   RBC / HPF TOO NUMEROUS TO COUNT  <3 RBC/hpf   Bacteria, UA FEW (*) RARE   Crystals CA OXALATE CRYSTALS (*)  NEGATIVE   12:04 AM We will place the patient on Levaquin as this should cover both skin and urinary tract infection. She is to continue her regimen of attempting to void naturally every 2 hours but emptying her bladder via her catheter if unable to void naturally. She will contact her surgeonlater today.   I personally performed the services described in this documentation, which was scribed in my presence. The recorded information has been reviewed and is accurate.    Stacy Fines, MD 04/09/14 0005

## 2014-04-09 ENCOUNTER — Telehealth: Payer: Self-pay | Admitting: Gynecologic Oncology

## 2014-04-09 MED ORDER — LEVOFLOXACIN 500 MG PO TABS: 500.0000 mg | ORAL_TABLET | Freq: Every day | ORAL | Status: DC

## 2014-04-09 MED ORDER — LEVOFLOXACIN 500 MG PO TABS
500.0000 mg | ORAL_TABLET | Freq: Once | ORAL | Status: AC
  Administered 2014-04-09: 500 mg via ORAL
  Filled 2014-04-09: qty 1

## 2014-04-09 NOTE — Telephone Encounter (Signed)
Spoke with patient about recent ED visit last pm due to "urine leaking around the catheter."  She stated she also noted a small amount of blood in her urine and it scared her so she went to the ED.  She had mildly strained to have a BM before seeing the blood as well.  Very anxious on the phone.  Stating she was given an antibiotic in the ED.  Urine culture still pending.  Advised to continue incisional care to the suprapubic catheter insertion site on the lower abdomen and to keep the area clean and dry.  No signs of infection upon assessment at visit yesterday.  Mild erythema noted around the insertion site with no drainage, increased warmth, etc.  Advised to continue with bladder training.  Reportable signs and symptoms reviewed.  Advised she would be contacted with her urine culture results.  She is advised to call for any questions or concerns and if after hours to call [phone] and listen to the entire message to reach the person on call.

## 2014-04-10 ENCOUNTER — Telehealth: Payer: Self-pay | Admitting: *Deleted

## 2014-04-10 LAB — URINE CULTURE
Colony Count: NO GROWTH
Culture: NO GROWTH

## 2014-04-10 NOTE — Telephone Encounter (Signed)
Per NP, notified pt patient know that her urine culture is negative and to not take the levaquin at this time. Pt verbalized she is still not able to urinate, encouraged pt to continue bladder training. Pt denied any further concerns.

## 2014-04-13 ENCOUNTER — Telehealth: Payer: Self-pay | Admitting: *Deleted

## 2014-04-13 NOTE — Telephone Encounter (Signed)
Called pt, states she did not urinate on her own over weekend however this morning she was able to urinate on her own. Pt advised she was not able to measure urine as she did not have the hat in the toilet. Encouraged pt to continue the good work , continue measuring urine and keep up the good work. Pt confirmed, denied further concerns.

## 2014-04-17 ENCOUNTER — Other Ambulatory Visit: Payer: Self-pay | Admitting: Gynecologic Oncology

## 2014-04-17 ENCOUNTER — Telehealth: Payer: Self-pay | Admitting: Gynecologic Oncology

## 2014-04-17 DIAGNOSIS — C539 Malignant neoplasm of cervix uteri, unspecified: Secondary | ICD-10-CM

## 2014-04-17 NOTE — Telephone Encounter (Signed)
Spoke with the patient about status with bladder training.  Reporting that she voided 100 cc this am with 175 cc emptied out of her suprapubic catheter after.  Prior to this, she reports only voiding small amounts around 25 cc with 200 to 275 cc out of her suprapubic.  Advised to continue efforts and recording.  Follow up appt arranged for May 15.  Reportable signs and symptoms reviewed.  Advised to call for any questions or concerns.

## 2014-04-23 ENCOUNTER — Ambulatory Visit: Payer: BC Managed Care – PPO | Admitting: Gynecologic Oncology

## 2014-04-24 ENCOUNTER — Encounter: Payer: Self-pay | Admitting: Gynecology

## 2014-04-24 ENCOUNTER — Ambulatory Visit: Payer: BC Managed Care – PPO | Attending: Gynecology | Admitting: Gynecology

## 2014-04-24 VITALS — BP 116/67 | HR 73 | Temp 97.7°F | Resp 20 | Ht 61.0 in | Wt 121.9 lb

## 2014-04-24 DIAGNOSIS — Z79899 Other long term (current) drug therapy: Secondary | ICD-10-CM | POA: Insufficient documentation

## 2014-04-24 DIAGNOSIS — Z90710 Acquired absence of both cervix and uterus: Secondary | ICD-10-CM | POA: Insufficient documentation

## 2014-04-24 DIAGNOSIS — F17200 Nicotine dependence, unspecified, uncomplicated: Secondary | ICD-10-CM | POA: Insufficient documentation

## 2014-04-24 DIAGNOSIS — C539 Malignant neoplasm of cervix uteri, unspecified: Secondary | ICD-10-CM

## 2014-04-24 NOTE — Progress Notes (Signed)
Consult Note: Gyn-Onc   Stacy Buck 33 y.o. female  Chief Complaint  Patient presents with  . 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 Good postoperative recovery. Good return to bladder function.  Plan:    Patient has intermediate risk features and is scheduled to see Dr. Sondra Come on May 20 for initial consultation with the intention of treating the whole pelvis with radiation therapy. The rationale for radiation therapy and expected side questions are answered. effects were discussed.  Suprapubic catheter is removed today. The patient continued to void every 3 hours.  Interval history: Patient returns today having undergone a radical hysterectomy and pelvic lymphadenectomy on 03/31/2014. She had an uncomplicated postoperative course. Final pathology showed that there was no involvement of lymph nodes although she had deep cervical invasion a 4.5 cm lesion and angiolymphatic invasion.  Patient has been clamping and releasing her suprapubic catheter and is now voiding approximately 200 cc of a 50-25 cc residual.  HPI:  33 year old gravida 2 para 2 seen in consultation request of Dr. Christophe Louis regarding management of a newly diagnosed cervical carcinoma. Patient believes she had her last Pap smear in 2011. Recently she had a Pap smear showing atypical squamous cells and high-grade dysplasia cannot be ruled out. 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 In retrospect the patient reports she's had postcoital bleeding for several months. She denies any pelvic pain or any GI or GU symptoms. Functional status is excellent.  She had a CT scan obtained on Artemisa 7 that shows no evidence of metastatic disease  The patient underwent a radical hysterectomy and pelvic lymphadenectomy 03/31/2014. Final pathology showed a 4.5 cm lesion, deep cervical stromal invasion, and angiolymphatic involvement. Postoperative  hole pelvis radiation therapy was recommended.  Patient has no other serious medical problems.  Review of Systems:10 point review of systems is negative except as noted in interval history.   Vitals: Blood pressure 116/67, pulse 73, temperature 97.7 F (36.5 C), temperature source Oral, resp. rate 20, height 5\' 1"  (1.549 m), weight 121 lb 14.4 oz (55.293 kg).  Physical Exam: General : The patient is a healthy woman in no acute distress.  HEENT: normocephalic, extraoccular movements normal; neck is supple without thyromegally  Lynphnodes: Supraclavicular and inguinal nodes not enlarged  Abdomen: Soft, non-tender, no ascites, no organomegally, no masses, no hernias Pfannenstiel incision is well-healed. Suprapubic site is healthy. Pelvic:  EGBUS: Normal female  Vagina: Normal, no lesions  Urethra and Bladder: Normal, non-tender  Cervix: Surgically absent Uterus surgically absent  Bi-manual examination: Non-tender; no adenxal masses or nodularity  Rectal: normal sphincter tone, no masses, no blood , there is no parametrial involvement Lower extremities: No edema or varicosities. Normal range of motion      No Known Allergies  Past Medical History  Diagnosis Date  . Cancer 03/11/2014    cervical cancer  . Anxiety   . Psoriasis     patches on elbows, knees, legs ,abdomen, buttocks.     Past Surgical History  Procedure Laterality Date  . Childbirth      x2 NVD- epidural anesthesia  . Radical hysterectomy N/A 03/31/2014    Procedure: EXPLORATORY LAPAROTOMY/RADICAL HYSTERECTOMY/LYMPHNODE DISECTION;  Surgeon: Alvino Chapel, MD;  Location: [facility];  Service: Gynecology;  Laterality: N/A;    Current Outpatient Prescriptions  Medication Sig Dispense Refill  . ibuprofen (ADVIL,MOTRIN) 200 MG tablet Take 200-400 mg by mouth every  6 (six) hours as needed for mild pain or moderate pain.      Marland Kitchen venlafaxine XR (EFFEXOR-XR) 75 MG 24 hr capsule Take 75 mg by mouth daily with  breakfast.      . acetaminophen (TYLENOL) 500 MG tablet Take 500 mg by mouth once.      . ALPRAZolam (XANAX) 0.5 MG tablet Take 0.25 mg by mouth 2 (two) times daily as needed for anxiety.       . calcium carbonate (TUMS - DOSED IN MG ELEMENTAL CALCIUM) 500 MG chewable tablet Chew 1 tablet by mouth daily.      Marland Kitchen levofloxacin (LEVAQUIN) 500 MG tablet Take 1 tablet (500 mg total) by mouth daily.  7 tablet  0  . Multiple Vitamin (MULTIVITAMIN WITH MINERALS) TABS tablet Take 1 tablet by mouth daily.      Marland Kitchen oxyCODONE-acetaminophen (PERCOCET/ROXICET) 5-325 MG per tablet Take 1-2 tablets by mouth every 4 (four) hours as needed for severe pain.  30 tablet  0   No current facility-administered medications for this visit.    History   Social History  . Marital Status: Married    Spouse Name: N/A    Number of Children: 2  . 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 Smoking status: Current Every Day Smoker -- 0.50 packs/day for 15 years    Types: Cigarettes  . Smokeless tobacco: Not on file     Comment: cut back to 5 cigs per day  . Alcohol Use: No  . Drug Use: No  . Sexual Activity: Yes   Other Topics Concern  . Not on file   Social History Narrative  . No narrative on file    Family History  Problem Relation Age of Onset  . Diabetes Father   . Coronary artery disease Father   . Stroke Maternal Grandfather   . Diabetes Paternal Grandmother       Alvino Chapel, MD 04/24/2014, 12:32 PM

## 2014-04-24 NOTE — Patient Instructions (Signed)
Follow up on June 5th. Continue bladder training. Change Dressing each night x 4 nights after you shower.

## 2014-04-24 NOTE — Progress Notes (Signed)
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  Biopsies revealed:   03/31/14 Diagnosis 1. 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 - VAGINAL RESECTION MARGIN, NEGATIVE FOR DYSPLASIA OR MALIGNANCY. - PARAMETRIAL TISSUE: FIVE LYMPH NODES AND BENIGN ADIPOSE TISSUE, NO EVIDENCE OF MALIGNANCY, (0/5). 2. Lymph nodes, regional resection, left pelvic - EIGHT LYMPH NODES, NEGATIVE FOR METASTATIC CARCINOMA (0/8). 3. Lymph nodes, regional resection, right pelvic - SIX LYMPH NODES, NEGATIVE FOR METASTATIC CARCINOMA (0/6)  Past/Anticipated interventions by Gyn/Onc surgery, if any: 03/31/14 - Procedure: EXPLORATORY LAPAROTOMY/RADICAL HYSTERECTOMY/LYMPHNODE DISECTION;  Surgeon: Alvino Chapel, MD;  Location: [facility];  Service: Gynecology;  Laterality: N/A;  Past/Anticipated interventions by medical oncology, if any: no  Weight changes, if any: lost 10 lbs  Bowel/Bladder complaints, if any: patient reports constipation.  She is taking colace.  Advised her to take miralax once a day.  Nausea/Vomiting, if any: no  Pain issues, if any:  no  SAFETY ISSUES:  Prior radiation? no  Pacemaker/ICD? no  Possible current pregnancy? no  Is the patient on methotrexate? no  Current Complaints / other details:  Patient has 2 children.  She is a Pharmacist, hospital at a day care.

## 2014-04-27 ENCOUNTER — Encounter (HOSPITAL_COMMUNITY): Payer: Self-pay | Admitting: Gynecology

## 2014-04-27 NOTE — OR Nursing (Signed)
End time added 

## 2014-04-29 ENCOUNTER — Encounter: Payer: Self-pay | Admitting: Radiation Oncology

## 2014-04-29 ENCOUNTER — Ambulatory Visit
Admission: RE | Admit: 2014-04-29 | Discharge: 2014-04-29 | Disposition: A | Discharge status: Home / Self Care | Payer: BC Managed Care – PPO | Source: Ambulatory Visit | Attending: Radiation Oncology | Admitting: Radiation Oncology

## 2014-04-29 VITALS — BP 138/73 | HR 63 | Temp 98.2°F | Ht 61.0 in | Wt 123.1 lb

## 2014-04-29 DIAGNOSIS — Z510 Encounter for antineoplastic radiation therapy: Secondary | ICD-10-CM | POA: Insufficient documentation

## 2014-04-29 DIAGNOSIS — Z90710 Acquired absence of both cervix and uterus: Secondary | ICD-10-CM | POA: Insufficient documentation

## 2014-04-29 DIAGNOSIS — K644 Residual hemorrhoidal skin tags: Secondary | ICD-10-CM | POA: Diagnosis not present

## 2014-04-29 DIAGNOSIS — F411 Generalized anxiety disorder: Secondary | ICD-10-CM | POA: Insufficient documentation

## 2014-04-29 DIAGNOSIS — C539 Malignant neoplasm of cervix uteri, unspecified: Secondary | ICD-10-CM

## 2014-04-29 DIAGNOSIS — F17200 Nicotine dependence, unspecified, uncomplicated: Secondary | ICD-10-CM | POA: Insufficient documentation

## 2014-04-29 NOTE — Progress Notes (Signed)
Radiation Oncology         [phone]) [phone] ________________________________  Initial outpatient Consultation  Name: Stacy Buck MRN: [id number]  Date: 04/29/2014  DOB: Jun 12, 1981  AO:[id number],HQIO, PA-C  Clarke-Pearson, Daniel *   REFERRING PHYSICIAN: Marti Sleigh *  DIAGNOSIS:Stage IB-1 squamous cell carcinoma cervix with deep cervical stromal invasion and angiolymphatic involvement   HISTORY OF PRESENT ILLNESS::Stacy Buck is a 33 y.o. female who is seen out of the courtesy of Dr. Marti Sleigh for an opinion concerning radiation therapy as part of management of patient's recently diagnosed cervical cancer.  She is a 33 year old gravida 2 para 2 followed by Dr. Christophe Louis.  Patient believes she had her last Pap smear in 2011. Recently she had a Pap smear showing atypical squamous cells and high-grade dysplasia cannot be ruled out. 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  The patient was referred to Dr. Fermin Schwab and on 03/31/2014 underwent a radical hysterectomy and pelvic lymphadenectomy. Patient had an uncomplicated postoperative course. Final pathology showed deep cervical invasion with a 4.5 cm lesion and angiolymphatic invasion. A total of 14 lymph nodes were removed from the pelvis area none of which showed metastasis. In light of the pathologic findings the patient is now referred to radiation oncology for consideration for postoperative treatments.   PREVIOUS RADIATION THERAPY: No  PAST MEDICAL HISTORY:  has a past medical history of Cancer (03/11/2014); Anxiety; and Psoriasis.    PAST SURGICAL HISTORY: Past Surgical History  Procedure Laterality Date  . Childbirth      x2 NVD- epidural anesthesia  . Radical hysterectomy N/A 03/31/2014    Procedure: EXPLORATORY LAPAROTOMY/RADICAL HYSTERECTOMY/LYMPHNODE DISECTION;  Surgeon: Alvino Chapel, MD;  Location: [facility];  Service: Gynecology;  Laterality: N/A;     FAMILY HISTORY: family history includes Coronary artery disease in her father; Diabetes in her father and paternal grandmother; Stroke in her maternal grandfather.  SOCIAL HISTORY:  reports that she has been smoking Cigarettes.  She has a 7.5 pack-year smoking history. She does not have any smokeless tobacco history on file. She reports that she does not drink alcohol or use illicit drugs.  ALLERGIES: Review of patient's allergies indicates no known allergies.  MEDICATIONS:  Current Outpatient Prescriptions  Medication Sig Dispense Refill  . docusate sodium (COLACE) 100 MG capsule Take 100 mg by mouth 2 (two) times daily.      Marland Kitchen ibuprofen (ADVIL,MOTRIN) 200 MG tablet Take 200-400 mg by mouth every 6 (six) hours as needed for mild pain or moderate pain.      Marland Kitchen venlafaxine XR (EFFEXOR-XR) 75 MG 24 hr capsule Take 75 mg by mouth daily with breakfast.      . acetaminophen (TYLENOL) 500 MG tablet Take 500 mg by mouth once.      . ALPRAZolam (XANAX) 0.5 MG tablet Take 0.25 mg by mouth 2 (two) times daily as needed for anxiety.       . calcium carbonate (TUMS - DOSED IN MG ELEMENTAL CALCIUM) 500 MG chewable tablet Chew 1 tablet by mouth daily.      Marland Kitchen levofloxacin (LEVAQUIN) 500 MG tablet Take 1 tablet (500 mg total) by mouth daily.  7 tablet  0  . Multiple Vitamin (MULTIVITAMIN WITH MINERALS) TABS tablet Take 1 tablet by mouth daily.      Marland Kitchen oxyCODONE-acetaminophen (PERCOCET/ROXICET) 5-325 MG per tablet Take 1-2 tablets by mouth every 4 (four) hours as needed for severe pain.  30 tablet  0   No current  facility-administered medications for this encounter.    REVIEW OF SYSTEMS:  A 15 point review of systems is documented in the electronic medical record. This was obtained by the nursing staff. However, I reviewed this with the patient to discuss relevant findings and make appropriate changes.  Her suprapubic catheter has been removed. She does have fairly good bladder sensation at this point. She  has mild numbness in her left upper thigh region. She denies any problems with swelling in her lower extremities. She has had some problems with constipation. The patient feels she may have had some post coital bleeding prior to her abnormal Pap smear but no pelvic pain.   PHYSICAL EXAM:  height is 5\' 1"  (1.549 m) and weight is 123 lb 1.6 oz (55.838 kg). Her temperature is 98.2 F (36.8 C). Her blood pressure is 138/73 and her pulse is 63.   BP 138/73  Pulse 63  Temp(Src) 98.2 F (36.8 C)  Ht 5\' 1"  (1.549 m)  Wt 123 lb 1.6 oz (55.838 kg)  BMI 23.27 kg/m2  General Appearance:    Alert, cooperative, no distress, appears stated age  Head:    Normocephalic, without obvious abnormality, atraumatic  Eyes:    PERRL, conjunctiva/corneas clear, EOM's intact,       Ears:    Normal TM's and external ear canals, both ears  Nose:   Nares normal, septum midline, mucosa normal, no drainage    or sinus tenderness  Throat:   Lips, mucosa, and tongue normal; teeth and gums normal  Neck:   Supple, symmetrical, trachea midline, no adenopathy;    thyroid:  no enlargement/tenderness/nodules; no carotid   bruit or JVD  Back:     Symmetric, no curvature, ROM normal, no CVA tenderness  Lungs:     Clear to auscultation bilaterally, respirations unlabored  Chest Wall:    No tenderness or deformity   Heart:    Regular rate and rhythm, S1 and S2 normal, no murmur, rub   or gallop     Abdomen:     Soft, non-tender, bowel sounds active all four quadrants,    no masses, no organomegaly,  Pfannenstiel incision is well-healed.    Genitalia:   deferred until simulation and planning day   Rectal:   deferred until simulation and planning day  Extremities:   Extremities normal, atraumatic, no cyanosis or edema  Pulses:   2+ and symmetric all extremities  Skin:   Skin color, texture, turgor normal, no rashes or lesions  Lymph nodes:   Cervical, supraclavicular, and axillary nodes normal  Neurologic:    normal strength,  sensation and reflexes    throughout     ECOG = 1    1 - Symptomatic but completely ambulatory (Restricted in physically strenuous activity but ambulatory and able to carry out work of a light or sedentary nature. For example, light housework, office work)  LABORATORY DATA:  Lab Results  Component Value Date   WBC 11.7* 04/02/2014   HGB 8.8* 04/02/2014   HCT 26.1* 04/02/2014   MCV 90.0 04/02/2014   PLT 202 04/02/2014   NEUTROABS 7.2 03/25/2014   Lab Results  Component Value Date   NA 141 04/02/2014   K 4.4 04/02/2014   CL 108 04/02/2014   CO2 25 04/02/2014   GLUCOSE 96 04/02/2014   CREATININE 0.71 04/02/2014   CALCIUM 8.0* 04/02/2014      RADIOGRAPHY: No results found.    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  The patient will be at risk for pelvic recurrence and in light of this issue I would recommend postoperative radiation treatments. To limit dose to the small bowel and to hopefully avoid the transpositioned ovaries,  I would recommend intensity modulated radiation therapy for her postoperative treatments. I discussed treatment course,  side effects and potential toxicities of radiation therapy in this situation with the patient. She appears to understand and wish to proceed with planned course of treatment.   PLAN:Simulation and planning June 9 after her gynecologic exam on June 5 by Dr. Fermin Schwab to ensure that she is well-healed to begin postoperative treatment. I spent 60 minutes minutes face to face with the patient and more than 50% of that time was spent in counseling and/or coordination of care.   ------------------------------------------------  Blair Promise, PhD, MD

## 2014-04-29 NOTE — Progress Notes (Signed)
Please see the Nurse Progress Note in the MD Initial Consult Encounter for this patient. 

## 2014-05-06 ENCOUNTER — Encounter: Payer: Self-pay | Admitting: Oncology

## 2014-05-06 NOTE — Progress Notes (Signed)
Per Dr. Sondra Come, ok to use BUN and Creatinine (10 and 0.71) from 4/23 for 5/9 CT SIM with IV contrast.

## 2014-05-15 ENCOUNTER — Ambulatory Visit: Payer: BC Managed Care – PPO | Attending: Gynecology | Admitting: Gynecology

## 2014-05-15 ENCOUNTER — Encounter: Payer: Self-pay | Admitting: Gynecology

## 2014-05-15 VITALS — BP 158/85 | HR 61 | Temp 98.3°F | Ht 61.0 in | Wt 127.3 lb

## 2014-05-15 DIAGNOSIS — L408 Other psoriasis: Secondary | ICD-10-CM | POA: Insufficient documentation

## 2014-05-15 DIAGNOSIS — C539 Malignant neoplasm of cervix uteri, unspecified: Secondary | ICD-10-CM

## 2014-05-15 DIAGNOSIS — Z90710 Acquired absence of both cervix and uterus: Secondary | ICD-10-CM | POA: Insufficient documentation

## 2014-05-15 DIAGNOSIS — F17200 Nicotine dependence, unspecified, uncomplicated: Secondary | ICD-10-CM | POA: Insufficient documentation

## 2014-05-15 DIAGNOSIS — F411 Generalized anxiety disorder: Secondary | ICD-10-CM | POA: Insufficient documentation

## 2014-05-15 DIAGNOSIS — Z79899 Other long term (current) drug therapy: Secondary | ICD-10-CM | POA: Insufficient documentation

## 2014-05-15 NOTE — Progress Notes (Signed)
Consult Note: Gyn-Onc   Stacy Buck 33 y.o. female  Chief Complaint  Patient presents with  . 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 Good postoperative recovery  Plan:    Patient has intermediate risk features and is scheduled to begin pelvic radiation therapy on 05/19/2014. She has previously seen  Dr. Sondra Buck on May 20 for initial consultation. The patient given the okay to return to full levels of activity and may return to work. She return to see me in approximately 2 months. Had MiraLAX to the constipation regimen. It's okay to use a multivitamin. Consider setting of our proximal of the middle the night to void in order to avoid urge incontinence in the morning.   Interval history: Patient returns today having undergone a radical hysterectomy and pelvic lymphadenectomy on 03/31/2014. She had an uncomplicated postoperative course. Final pathology showed that there was no involvement of lymph nodes although she had deep cervical invasion a 4.5 cm lesion and angiolymphatic invasion. She has been voiding well however in the morning she has some urge incontinence. She is not having urinary problems during the rest of the day. She is having some difficulty with constipation despite the fact she uses a stool softener and mild laxative. She denies any vaginal discharge or bleeding her appetite is good and her functional status is excellent.  HPI:  33 year old gravida 2 para 2 seen in consultation request of Dr. Christophe Buck regarding management of a newly diagnosed cervical carcinoma. Patient believes she had her last Pap smear in 2011. Recently she had a Pap smear showing atypical squamous cells and high-grade dysplasia cannot be ruled out. 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 In retrospect the patient reports she's had postcoital bleeding for several months. She denies any pelvic pain or any GI or GU  symptoms. Functional status is excellent.  She had a CT scan obtained on Carolje[place] 7 that shows no evidence of metastatic disease  The patient underwent a radical hysterectomy and pelvic lymphadenectomy 03/31/2014. Final pathology showed a 4.5 cm lesion, deep cervical stromal invasion, and angiolymphatic involvement. Postoperative hole pelvis radiation therapy was recommended.   Review of Systems:10 point review of systems is negative except as noted in interval history.   Vitals: Blood pressure 158/85, pulse 61, temperature 98.3 F (36.8 C), temperature source Oral, height 5\' 1"  (1.549 m), weight 127 lb 4.8 oz (57.743 kg).  Physical Exam: General : The patient is a healthy woman in no acute distress.  HEENT: normocephalic, extraoccular movements normal; neck is supple without thyromegally  Lynphnodes: Supraclavicular and inguinal nodes not enlarged  Abdomen: Soft, non-tender, no ascites, no organomegally, no masses, no hernias Pfannenstiel incision is well-healed. Suprapubic site is healthy. Pelvic:  EGBUS: Normal female  Vagina: Normal, no lesions cuff is well-healed. Urethra and Bladder: Normal, non-tender  Cervix: Surgically absent Uterus surgically absent  Bi-manual examination: Non-tender; no adenxal masses or nodularity  Rectal: normal sphincter tone, no masses, no blood , there is no parametrial involvement Lower extremities: No edema or varicosities. Normal range of motion      No Known Allergies  Past Medical History  Diagnosis Date  . Cancer 03/11/2014    cervical cancer  . Anxiety   . Psoriasis     patches on elbows, knees, legs ,abdomen, buttocks.     Past Surgical History  Procedure Laterality Date  . Childbirth  x2 NVD- epidural anesthesia  . Radical hysterectomy N/A 03/31/2014    Procedure: EXPLORATORY LAPAROTOMY/RADICAL HYSTERECTOMY/LYMPHNODE DISECTION;  Surgeon: Alvino Chapel, MD;  Location: [facility];  Service: Gynecology;  Laterality: N/A;     Current Outpatient Prescriptions  Medication Sig Dispense Refill  . docusate sodium (COLACE) 100 MG capsule Take 100 mg by mouth 2 (two) times daily.      Marland Kitchen ibuprofen (ADVIL,MOTRIN) 200 MG tablet Take 200-400 mg by mouth every 6 (six) hours as needed for mild pain or moderate pain.      Marland Kitchen venlafaxine XR (EFFEXOR-XR) 75 MG 24 hr capsule Take 75 mg by mouth daily with breakfast.       No current facility-administered medications for this visit.    History   Social History  . Marital Status: Married    Spouse Name: N/A    Number of Children: 2  . 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 Smoking status: Current Every Day Smoker -- 0.50 packs/day for 15 years    Types: Cigarettes  . Smokeless tobacco: Not on file     Comment: cut back to 5 cigs per day  . Alcohol Use: No  . Drug Use: No  . Sexual Activity: Yes   Other Topics Concern  . Not on file   Social History Narrative  . No narrative on file    Family History  Problem Relation Age of Onset  . Diabetes Father   . Coronary artery disease Father   . Stroke Maternal Grandfather   . Diabetes Paternal [place], MD 05/15/2014, 11:53 AM

## 2014-05-15 NOTE — Patient Instructions (Signed)
Follow up in 40months with Dr. Fermin Schwab after Radiation is complete.

## 2014-05-19 ENCOUNTER — Ambulatory Visit
Admission: RE | Admit: 2014-05-19 | Discharge: 2014-05-19 | Disposition: A | Discharge status: Home / Self Care | Payer: BC Managed Care – PPO | Source: Ambulatory Visit | Attending: Radiation Oncology | Admitting: Radiation Oncology

## 2014-05-19 DIAGNOSIS — Z510 Encounter for antineoplastic radiation therapy: Secondary | ICD-10-CM | POA: Diagnosis not present

## 2014-05-19 DIAGNOSIS — C539 Malignant neoplasm of cervix uteri, unspecified: Secondary | ICD-10-CM

## 2014-05-19 NOTE — Progress Notes (Signed)
Pt in nursing for IV start for ct sim this morning. She denies pain.

## 2014-05-20 NOTE — Progress Notes (Signed)
  Radiation Oncology         [id number]) [phone] ________________________________  Name: Stacy Buck MRN: [id number]  Date: 05/19/2014  DOB: 10-12-1981  SIMULATION AND TREATMENT PLANNING NOTE  DIAGNOSIS: Stage IB-1 squamous cell carcinoma cervix with deep cervical stromal invasion and angiolymphatic involvement   NARRATIVE:  The patient was brought to the [place].  Identity was confirmed.  All relevant records and images related to the planned course of therapy were reviewed.  The patient freely provided informed written consent to proceed with treatment after reviewing the details related to the planned course of therapy. The consent form was witnessed and verified by the simulation staff.  Then, the patient was set-up in a stable reproducible  supine position for radiation therapy.  CT images were obtained.  Surface markings were placed.  The CT images were loaded into the planning software.  Then the target and avoidance structures were contoured.  Treatment planning then occurred.  The radiation prescription was entered and confirmed.  Then, I designed and supervised the construction of a total of 1 medically necessary complex treatment devices.  I have requested : Intensity Modulated Radiotherapy (IMRT) is medically necessary for this case for the following reason:  Small bowel sparing..  I have ordered:dose calc.  PLAN:  The patient will receive 50.4 Gy in 28 fractions.  ________________________________   Blair Promise, PhD, MD

## 2014-05-27 DIAGNOSIS — Z510 Encounter for antineoplastic radiation therapy: Secondary | ICD-10-CM | POA: Diagnosis not present

## 2014-05-28 ENCOUNTER — Ambulatory Visit: Payer: BC Managed Care – PPO | Admitting: Radiation Oncology

## 2014-05-28 DIAGNOSIS — Z510 Encounter for antineoplastic radiation therapy: Secondary | ICD-10-CM | POA: Diagnosis not present

## 2014-05-29 ENCOUNTER — Ambulatory Visit: Payer: BC Managed Care – PPO

## 2014-06-01 ENCOUNTER — Ambulatory Visit
Admission: RE | Admit: 2014-06-01 | Discharge: 2014-06-01 | Disposition: A | Discharge status: Home / Self Care | Payer: BC Managed Care – PPO | Source: Ambulatory Visit | Attending: Radiation Oncology | Admitting: Radiation Oncology

## 2014-06-01 ENCOUNTER — Encounter: Payer: Self-pay | Admitting: Radiation Oncology

## 2014-06-01 ENCOUNTER — Ambulatory Visit: Payer: BC Managed Care – PPO

## 2014-06-01 DIAGNOSIS — C539 Malignant neoplasm of cervix uteri, unspecified: Secondary | ICD-10-CM

## 2014-06-01 DIAGNOSIS — Z510 Encounter for antineoplastic radiation therapy: Secondary | ICD-10-CM | POA: Diagnosis not present

## 2014-06-01 NOTE — Progress Notes (Signed)
Chart note: The patient underwent Tomotherapy segmentation today for treatment to her pelvis in the management of her cancer of the cervix. She is being treated to 8.7 delivered field widths corresponding to one set of IMRT treatment devices [phone]).

## 2014-06-01 NOTE — Progress Notes (Signed)
Stacy Buck was given the Radiation Therapy and You book and discussed potential side effects/managment of fatigue, diarrhea, nausea, skin changes and bladder changes.  She was given a sitz bath and was instructed on how to use it for skin irritation as needed.  She was oriented to the clinic and was educated about under treat day with Dr. Sondra Come on Tuesday's.  She was advised to call with any questions or concerns.

## 2014-06-02 ENCOUNTER — Ambulatory Visit: Payer: BC Managed Care – PPO | Admitting: Radiation Oncology

## 2014-06-02 ENCOUNTER — Ambulatory Visit
Admission: RE | Admit: 2014-06-02 | Discharge: 2014-06-02 | Disposition: A | Discharge status: Home / Self Care | Payer: BC Managed Care – PPO | Source: Ambulatory Visit | Attending: Radiation Oncology | Admitting: Radiation Oncology

## 2014-06-02 DIAGNOSIS — Z510 Encounter for antineoplastic radiation therapy: Secondary | ICD-10-CM | POA: Diagnosis not present

## 2014-06-03 ENCOUNTER — Ambulatory Visit
Admission: RE | Admit: 2014-06-03 | Discharge: 2014-06-03 | Disposition: A | Discharge status: Home / Self Care | Payer: BC Managed Care – PPO | Source: Ambulatory Visit | Attending: Radiation Oncology | Admitting: Radiation Oncology

## 2014-06-03 DIAGNOSIS — Z510 Encounter for antineoplastic radiation therapy: Secondary | ICD-10-CM | POA: Diagnosis not present

## 2014-06-04 ENCOUNTER — Ambulatory Visit
Admission: RE | Admit: 2014-06-04 | Payer: BC Managed Care – PPO | Source: Ambulatory Visit | Admitting: Radiation Oncology

## 2014-06-04 ENCOUNTER — Ambulatory Visit
Admission: RE | Admit: 2014-06-04 | Discharge: 2014-06-04 | Disposition: A | Discharge status: Home / Self Care | Payer: BC Managed Care – PPO | Source: Ambulatory Visit | Attending: Radiation Oncology | Admitting: Radiation Oncology

## 2014-06-04 DIAGNOSIS — Z510 Encounter for antineoplastic radiation therapy: Secondary | ICD-10-CM | POA: Diagnosis not present

## 2014-06-05 ENCOUNTER — Encounter: Payer: Self-pay | Admitting: Radiation Oncology

## 2014-06-05 ENCOUNTER — Ambulatory Visit
Admission: RE | Admit: 2014-06-05 | Discharge: 2014-06-05 | Disposition: A | Discharge status: Home / Self Care | Payer: BC Managed Care – PPO | Source: Ambulatory Visit | Attending: Radiation Oncology | Admitting: Radiation Oncology

## 2014-06-05 VITALS — BP 147/87 | HR 69 | Temp 98.2°F | Resp 20 | Wt 127.6 lb

## 2014-06-05 DIAGNOSIS — C539 Malignant neoplasm of cervix uteri, unspecified: Secondary | ICD-10-CM

## 2014-06-05 DIAGNOSIS — Z510 Encounter for antineoplastic radiation therapy: Secondary | ICD-10-CM | POA: Diagnosis not present

## 2014-06-05 NOTE — Progress Notes (Signed)
   Department of Radiation Oncology  Phone:  [phone] Fax:        [phone]  Weekly Treatment Note    Name: Stacy Buck Date: 06/05/2014 MRN: [id number] DOB: [birth year]   Current dose: 9 Gy  Current fraction:5   MEDICATIONS: Current Outpatient Prescriptions  Medication Sig Dispense Refill  . docusate sodium (COLACE) 100 MG capsule Take 100 mg by mouth 2 (two) times daily.      Marland Kitchen ibuprofen (ADVIL,MOTRIN) 200 MG tablet Take 200-400 mg by mouth every 6 (six) hours as needed for mild pain or moderate pain.      Marland Kitchen venlafaxine XR (EFFEXOR-XR) 75 MG 24 hr capsule Take 75 mg by mouth daily with breakfast.       No current facility-administered medications for this encounter.     ALLERGIES: Review of patient's allergies indicates no known allergies.   LABORATORY DATA:  Lab Results  Component Value Date   WBC 11.7* 04/02/2014   HGB 8.8* 04/02/2014   HCT 26.1* 04/02/2014   MCV 90.0 04/02/2014   PLT 202 04/02/2014   Lab Results  Component Value Date   NA 141 04/02/2014   K 4.4 04/02/2014   CL 108 04/02/2014   CO2 25 04/02/2014   Lab Results  Component Value Date   ALT 11 03/25/2014   AST 12 03/25/2014   ALKPHOS 72 03/25/2014   BILITOT 0.3 03/25/2014     NARRATIVE: Stacy Buck was seen today for weekly treatment management. The chart was checked and the patient's films were reviewed. The patient has completed her first week of treatment. She states this has gone well. She denies any difficulties with treatment at this time. No fatigue, no GU issues currently. All of her questions were answered.  PHYSICAL EXAMINATION: weight is 127 lb 9.6 oz (57.879 kg). Her oral temperature is 98.2 F (36.8 C). Her blood pressure is 147/87 and her pulse is 69. Her respiration is 20.        ASSESSMENT: The patient is doing satisfactorily with treatment.  PLAN: We will continue with the patient's radiation treatment as planned.

## 2014-06-05 NOTE — Progress Notes (Signed)
Patient denies pain, fatigue, vaginal discharge, urinary issues, loss of appetite.

## 2014-06-08 ENCOUNTER — Ambulatory Visit
Admission: RE | Admit: 2014-06-08 | Discharge: 2014-06-08 | Disposition: A | Discharge status: Home / Self Care | Payer: BC Managed Care – PPO | Source: Ambulatory Visit | Attending: Radiation Oncology | Admitting: Radiation Oncology

## 2014-06-08 DIAGNOSIS — Z510 Encounter for antineoplastic radiation therapy: Secondary | ICD-10-CM | POA: Diagnosis not present

## 2014-06-09 ENCOUNTER — Ambulatory Visit
Admission: RE | Admit: 2014-06-09 | Discharge: 2014-06-09 | Disposition: A | Discharge status: Home / Self Care | Payer: BC Managed Care – PPO | Source: Ambulatory Visit | Attending: Radiation Oncology | Admitting: Radiation Oncology

## 2014-06-09 ENCOUNTER — Encounter: Payer: Self-pay | Admitting: Radiation Oncology

## 2014-06-09 VITALS — BP 146/83 | HR 82 | Temp 98.3°F | Resp 20 | Wt 125.9 lb

## 2014-06-09 DIAGNOSIS — Z510 Encounter for antineoplastic radiation therapy: Secondary | ICD-10-CM | POA: Diagnosis not present

## 2014-06-09 DIAGNOSIS — C539 Malignant neoplasm of cervix uteri, unspecified: Secondary | ICD-10-CM

## 2014-06-09 NOTE — Progress Notes (Signed)
  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 . . . . . . . The patient presents for routine under treatment assessment.                                   The patient is without complaint. She continues to work her usual schedule.                                 Set-up films were reviewed.                                 The chart was checked. Physical Findings. . .  weight is 125 lb 14.4 oz (57.108 kg). Her oral temperature is 98.3 F (36.8 C). Her blood pressure is 146/83 and her pulse is 82. Her respiration is 20. . Weight essentially stable.  No significant changes. Impression . . . . . . . The patient is tolerating radiation. Plan . . . . . . . . . . . . Continue treatment as planned.  ________________________________   Blair Promise, PhD, MD

## 2014-06-09 NOTE — Progress Notes (Signed)
Patient denies pain, urinary/bowel issues, vaginal discharge, fatigue, loss of appetite.

## 2014-06-10 ENCOUNTER — Ambulatory Visit
Admission: RE | Admit: 2014-06-10 | Discharge: 2014-06-10 | Disposition: A | Discharge status: Home / Self Care | Payer: BC Managed Care – PPO | Source: Ambulatory Visit | Attending: Radiation Oncology | Admitting: Radiation Oncology

## 2014-06-10 DIAGNOSIS — Z510 Encounter for antineoplastic radiation therapy: Secondary | ICD-10-CM | POA: Diagnosis not present

## 2014-06-11 ENCOUNTER — Ambulatory Visit
Admission: RE | Admit: 2014-06-11 | Discharge: 2014-06-11 | Disposition: A | Discharge status: Home / Self Care | Payer: BC Managed Care – PPO | Source: Ambulatory Visit | Attending: Radiation Oncology | Admitting: Radiation Oncology

## 2014-06-11 DIAGNOSIS — Z510 Encounter for antineoplastic radiation therapy: Secondary | ICD-10-CM | POA: Diagnosis not present

## 2014-06-15 ENCOUNTER — Ambulatory Visit
Admission: RE | Admit: 2014-06-15 | Discharge: 2014-06-15 | Disposition: A | Discharge status: Home / Self Care | Payer: BC Managed Care – PPO | Source: Ambulatory Visit | Attending: Radiation Oncology | Admitting: Radiation Oncology

## 2014-06-15 DIAGNOSIS — Z510 Encounter for antineoplastic radiation therapy: Secondary | ICD-10-CM | POA: Diagnosis not present

## 2014-06-16 ENCOUNTER — Encounter: Payer: Self-pay | Admitting: Radiation Oncology

## 2014-06-16 ENCOUNTER — Ambulatory Visit
Admission: RE | Admit: 2014-06-16 | Discharge: 2014-06-16 | Disposition: A | Discharge status: Home / Self Care | Payer: BC Managed Care – PPO | Source: Ambulatory Visit | Attending: Radiation Oncology | Admitting: Radiation Oncology

## 2014-06-16 VITALS — BP 139/86 | HR 72 | Resp 16 | Wt 125.7 lb

## 2014-06-16 DIAGNOSIS — Z510 Encounter for antineoplastic radiation therapy: Secondary | ICD-10-CM | POA: Diagnosis not present

## 2014-06-16 DIAGNOSIS — C539 Malignant neoplasm of cervix uteri, unspecified: Secondary | ICD-10-CM

## 2014-06-16 NOTE — Progress Notes (Signed)
Vitals and weight stable. Patient denies pain. States, "I feel good." Denies dysuria, hematuria or diarrhea. Denies vaginal odor, itching or discharge. Denies headache, dizziness, nausea or vomiting.

## 2014-06-16 NOTE — Progress Notes (Signed)
  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 . . . . . . . The patient presents for routine under treatment assessment.                                   The patient is without complaint.  She continues to work her usual schedule without any difficulty                                 Set-up films were reviewed.                                 The chart was checked. Physical Findings. . .  weight is 125 lb 11.2 oz (57.017 kg). Her blood pressure is 139/86 and her pulse is 72. Her respiration is 16. . Weight essentially stable.  The lungs are clear. The heart has a regular rhythm and rate. Abdomen is soft and nontender with normal bowel sounds. Impression . . . . . . . The patient is tolerating radiation. Plan . . . . . . . . . . . . Continue treatment as planned.  ________________________________   Blair Promise, PhD, MD

## 2014-06-17 ENCOUNTER — Ambulatory Visit
Admission: RE | Admit: 2014-06-17 | Discharge: 2014-06-17 | Disposition: A | Discharge status: Home / Self Care | Payer: BC Managed Care – PPO | Source: Ambulatory Visit | Attending: Radiation Oncology | Admitting: Radiation Oncology

## 2014-06-17 DIAGNOSIS — Z510 Encounter for antineoplastic radiation therapy: Secondary | ICD-10-CM | POA: Diagnosis not present

## 2014-06-18 ENCOUNTER — Ambulatory Visit
Admission: RE | Admit: 2014-06-18 | Discharge: 2014-06-18 | Disposition: A | Discharge status: Home / Self Care | Payer: BC Managed Care – PPO | Source: Ambulatory Visit | Attending: Radiation Oncology | Admitting: Radiation Oncology

## 2014-06-18 DIAGNOSIS — Z510 Encounter for antineoplastic radiation therapy: Secondary | ICD-10-CM | POA: Diagnosis not present

## 2014-06-19 ENCOUNTER — Ambulatory Visit
Admission: RE | Admit: 2014-06-19 | Discharge: 2014-06-19 | Disposition: A | Discharge status: Home / Self Care | Payer: BC Managed Care – PPO | Source: Ambulatory Visit | Attending: Radiation Oncology | Admitting: Radiation Oncology

## 2014-06-19 DIAGNOSIS — Z510 Encounter for antineoplastic radiation therapy: Secondary | ICD-10-CM | POA: Diagnosis not present

## 2014-06-22 ENCOUNTER — Ambulatory Visit
Admission: RE | Admit: 2014-06-22 | Discharge: 2014-06-22 | Disposition: A | Discharge status: Home / Self Care | Payer: BC Managed Care – PPO | Source: Ambulatory Visit | Attending: Radiation Oncology | Admitting: Radiation Oncology

## 2014-06-22 DIAGNOSIS — Z510 Encounter for antineoplastic radiation therapy: Secondary | ICD-10-CM | POA: Diagnosis not present

## 2014-06-23 ENCOUNTER — Ambulatory Visit
Admission: RE | Admit: 2014-06-23 | Discharge: 2014-06-23 | Disposition: A | Discharge status: Home / Self Care | Payer: BC Managed Care – PPO | Source: Ambulatory Visit | Attending: Radiation Oncology | Admitting: Radiation Oncology

## 2014-06-23 ENCOUNTER — Encounter: Payer: Self-pay | Admitting: Radiation Oncology

## 2014-06-23 VITALS — BP 148/80 | HR 72 | Temp 97.9°F | Ht 61.0 in | Wt 124.9 lb

## 2014-06-23 DIAGNOSIS — Z510 Encounter for antineoplastic radiation therapy: Secondary | ICD-10-CM | POA: Diagnosis not present

## 2014-06-23 DIAGNOSIS — C539 Malignant neoplasm of cervix uteri, unspecified: Secondary | ICD-10-CM

## 2014-06-23 NOTE — Progress Notes (Signed)
  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 . . . . . . . The patient presents for routine under treatment assessment.                                   The patient is without complaint. She does have some mild fatigue but attributes this to her busy work schedule. She has some mild nausea at night but is not requiring medication for this issue.                                 Set-up films were reviewed.                                 The chart was checked. Physical Findings. . .  height is 5\' 1"  (1.549 m) and weight is 124 lb 14.4 oz (56.654 kg). Her oral temperature is 97.9 F (36.6 C). Her blood pressure is 148/80 and her pulse is 72. . Weight essentially stable.  No significant changes. Impression . . . . . . . The patient is tolerating radiation. Plan . . . . . . . . . . . . Continue treatment as planned.  ________________________________   Blair Promise, PhD, MD

## 2014-06-23 NOTE — Progress Notes (Signed)
Stacy Buck has completed 16/28 fractions to her pelvis.  She denies pain.  She reports occasional nausea, mostly at night.  She denies bladder changes, skin irritation, vaginal/rectal bleeding and diarrhea.  She reports some constipation and is taking a stool softener.  She reports slight fatigue.

## 2014-06-24 ENCOUNTER — Ambulatory Visit
Admission: RE | Admit: 2014-06-24 | Discharge: 2014-06-24 | Disposition: A | Discharge status: Home / Self Care | Payer: BC Managed Care – PPO | Source: Ambulatory Visit | Attending: Radiation Oncology | Admitting: Radiation Oncology

## 2014-06-24 DIAGNOSIS — Z510 Encounter for antineoplastic radiation therapy: Secondary | ICD-10-CM | POA: Diagnosis not present

## 2014-06-25 ENCOUNTER — Ambulatory Visit
Admission: RE | Admit: 2014-06-25 | Discharge: 2014-06-25 | Disposition: A | Discharge status: Home / Self Care | Payer: BC Managed Care – PPO | Source: Ambulatory Visit | Attending: Radiation Oncology | Admitting: Radiation Oncology

## 2014-06-25 DIAGNOSIS — Z510 Encounter for antineoplastic radiation therapy: Secondary | ICD-10-CM | POA: Diagnosis not present

## 2014-06-26 ENCOUNTER — Ambulatory Visit
Admission: RE | Admit: 2014-06-26 | Discharge: 2014-06-26 | Disposition: A | Discharge status: Home / Self Care | Payer: BC Managed Care – PPO | Source: Ambulatory Visit | Attending: Radiation Oncology | Admitting: Radiation Oncology

## 2014-06-26 ENCOUNTER — Ambulatory Visit: Payer: BC Managed Care – PPO

## 2014-06-26 DIAGNOSIS — Z510 Encounter for antineoplastic radiation therapy: Secondary | ICD-10-CM | POA: Diagnosis not present

## 2014-06-29 ENCOUNTER — Ambulatory Visit
Admission: RE | Admit: 2014-06-29 | Discharge: 2014-06-29 | Disposition: A | Discharge status: Home / Self Care | Payer: BC Managed Care – PPO | Source: Ambulatory Visit | Attending: Radiation Oncology | Admitting: Radiation Oncology

## 2014-06-29 DIAGNOSIS — Z510 Encounter for antineoplastic radiation therapy: Secondary | ICD-10-CM | POA: Diagnosis not present

## 2014-06-30 ENCOUNTER — Ambulatory Visit
Admission: RE | Admit: 2014-06-30 | Discharge: 2014-06-30 | Disposition: A | Discharge status: Home / Self Care | Payer: BC Managed Care – PPO | Source: Ambulatory Visit | Attending: Radiation Oncology | Admitting: Radiation Oncology

## 2014-06-30 ENCOUNTER — Encounter: Payer: Self-pay | Admitting: Radiation Oncology

## 2014-06-30 VITALS — BP 155/92 | HR 82 | Temp 98.5°F | Ht 61.0 in | Wt 124.0 lb

## 2014-06-30 DIAGNOSIS — Z510 Encounter for antineoplastic radiation therapy: Secondary | ICD-10-CM | POA: Diagnosis not present

## 2014-06-30 DIAGNOSIS — C539 Malignant neoplasm of cervix uteri, unspecified: Secondary | ICD-10-CM

## 2014-06-30 MED ORDER — HYDROCORTISONE ACETATE 25 MG RE SUPP: 25.0000 mg | Freq: Two times a day (BID) | RECTAL | Status: DC

## 2014-06-30 NOTE — Progress Notes (Signed)
  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 . . . . . . . The patient presents for routine under treatment assessment.                                   The patient developed diarrhea over the weekend and flare up of her hemorrhoids. She has had some mild rectal bleeding and discomfort with bowel movements. Patient has been given a prescription for [facility] suppositories concerning this issue. She has hemmorrhoidal cream at home for the external hemorrhoids.                                 Set-up films were reviewed.                                 The chart was checked. Physical Findings. . .  height is 5\' 1"  (1.549 m) and weight is 124 lb (56.246 kg). Her oral temperature is 98.5 F (36.9 C). Her blood pressure is 155/92 and her pulse is 82. . Weight essentially stable. The lungs are clear. The heart has a regular rhythm and rate. The abdomen is soft and nontender with normal bowel sounds. Impression . . . . . . . The patient is tolerating radiation. Plan . . . . . . . . . . . . Continue treatment as planned.  ________________________________   Blair Promise, PhD, MD

## 2014-06-30 NOTE — Progress Notes (Signed)
Stacy Buck has completed 21 fractions to her pelvis.  She denies pain.  She reports more fatigue at night.  She denies any bladder issues and skin irritation.  She reports occasional nausea.  She also reports seeing some blood when she wipes.  She has hemorrhoids and thinks this is what is causing the bleeding.  She reports having diarrhea over the weekend and is having more bowel movements.  She is going to get Imodium to use as needed.  Her bp was elevated at 155/92.  She reports being nervous about the rectal bleeding.

## 2014-07-01 ENCOUNTER — Ambulatory Visit
Admission: RE | Admit: 2014-07-01 | Discharge: 2014-07-01 | Disposition: A | Discharge status: Home / Self Care | Payer: BC Managed Care – PPO | Source: Ambulatory Visit | Attending: Radiation Oncology | Admitting: Radiation Oncology

## 2014-07-01 DIAGNOSIS — Z510 Encounter for antineoplastic radiation therapy: Secondary | ICD-10-CM | POA: Diagnosis not present

## 2014-07-02 ENCOUNTER — Ambulatory Visit
Admission: RE | Admit: 2014-07-02 | Discharge: 2014-07-02 | Disposition: A | Discharge status: Home / Self Care | Payer: BC Managed Care – PPO | Source: Ambulatory Visit | Attending: Radiation Oncology | Admitting: Radiation Oncology

## 2014-07-02 DIAGNOSIS — Z510 Encounter for antineoplastic radiation therapy: Secondary | ICD-10-CM | POA: Diagnosis not present

## 2014-07-03 ENCOUNTER — Ambulatory Visit
Admission: RE | Admit: 2014-07-03 | Discharge: 2014-07-03 | Disposition: A | Discharge status: Home / Self Care | Payer: BC Managed Care – PPO | Source: Ambulatory Visit | Attending: Radiation Oncology | Admitting: Radiation Oncology

## 2014-07-03 DIAGNOSIS — Z510 Encounter for antineoplastic radiation therapy: Secondary | ICD-10-CM | POA: Diagnosis not present

## 2014-07-06 ENCOUNTER — Ambulatory Visit
Admission: RE | Admit: 2014-07-06 | Discharge: 2014-07-06 | Disposition: A | Discharge status: Home / Self Care | Payer: BC Managed Care – PPO | Source: Ambulatory Visit | Attending: Radiation Oncology | Admitting: Radiation Oncology

## 2014-07-06 DIAGNOSIS — Z510 Encounter for antineoplastic radiation therapy: Secondary | ICD-10-CM | POA: Diagnosis not present

## 2014-07-07 ENCOUNTER — Ambulatory Visit: Payer: BC Managed Care – PPO

## 2014-07-07 ENCOUNTER — Ambulatory Visit
Admission: RE | Admit: 2014-07-07 | Discharge: 2014-07-07 | Disposition: A | Discharge status: Home / Self Care | Payer: BC Managed Care – PPO | Source: Ambulatory Visit | Attending: Radiation Oncology | Admitting: Radiation Oncology

## 2014-07-07 ENCOUNTER — Encounter: Payer: Self-pay | Admitting: Radiation Oncology

## 2014-07-07 VITALS — BP 147/84 | HR 77 | Temp 97.9°F | Ht 61.0 in | Wt 123.9 lb

## 2014-07-07 DIAGNOSIS — C539 Malignant neoplasm of cervix uteri, unspecified: Secondary | ICD-10-CM

## 2014-07-07 DIAGNOSIS — Z510 Encounter for antineoplastic radiation therapy: Secondary | ICD-10-CM | POA: Diagnosis not present

## 2014-07-07 NOTE — Progress Notes (Signed)
  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 . . . . . . . The patient presents for routine under treatment assessment.                                   The patient is without complaint. She has had some mild diarrhea but is not requiring any medication for this issue. She continues to work full-time.                                 Set-up films were reviewed.                                 The chart was checked. Physical Findings. . .  height is 5\' 1"  (1.549 m) and weight is 123 lb 14.4 oz (56.201 kg). Her oral temperature is 97.9 F (36.6 C). Her blood pressure is 147/84 and her pulse is 77. . The lungs are clear. The heart has regular rhythm and rate. The abdomen is soft and nontender with normal bowel sounds. Impression . . . . . . . The patient is tolerating radiation. Plan . . . . . . . . . . . . Continue treatment as planned.  ________________________________   Blair Promise, PhD, MD

## 2014-07-07 NOTE — Progress Notes (Signed)
Stacy Buck has completed 26 fractions to her pelvis. She denies pain, nausea, fatigue, skin irritation, urinary frequency, dysuria and vaginal/rectal bleeding.  She has been given a one month follow up card.

## 2014-07-08 ENCOUNTER — Ambulatory Visit
Admission: RE | Admit: 2014-07-08 | Discharge: 2014-07-08 | Disposition: A | Discharge status: Home / Self Care | Payer: BC Managed Care – PPO | Source: Ambulatory Visit | Attending: Radiation Oncology | Admitting: Radiation Oncology

## 2014-07-08 DIAGNOSIS — C539 Malignant neoplasm of cervix uteri, unspecified: Secondary | ICD-10-CM

## 2014-07-08 DIAGNOSIS — Z510 Encounter for antineoplastic radiation therapy: Secondary | ICD-10-CM | POA: Diagnosis not present

## 2014-07-09 ENCOUNTER — Ambulatory Visit
Admission: RE | Admit: 2014-07-09 | Discharge: 2014-07-09 | Disposition: A | Discharge status: Home / Self Care | Payer: BC Managed Care – PPO | Source: Ambulatory Visit | Attending: Radiation Oncology | Admitting: Radiation Oncology

## 2014-07-09 DIAGNOSIS — Z510 Encounter for antineoplastic radiation therapy: Secondary | ICD-10-CM | POA: Diagnosis not present

## 2014-07-21 ENCOUNTER — Encounter: Payer: Self-pay | Admitting: Radiation Oncology

## 2014-07-21 NOTE — Progress Notes (Signed)
  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   She did experience some diarrhea and flair up of her hemorrhoids which responded well to medication. Patient continue work full-time during the course of her therapy.  Plan: The patient has completed radiation treatment. The patient will return to radiation oncology clinic for routine followup in one month. I advised them to call or return sooner if they have any questions or concerns related to their recovery or treatment.  -----------------------------------  Blair Promise, PhD, MD

## 2014-07-24 ENCOUNTER — Encounter: Payer: Self-pay | Admitting: Gynecology

## 2014-07-24 ENCOUNTER — Ambulatory Visit: Payer: BC Managed Care – PPO | Attending: Gynecology | Admitting: Gynecology

## 2014-07-24 VITALS — BP 158/96 | HR 84 | Temp 98.8°F | Resp 20 | Ht 61.0 in | Wt 125.0 lb

## 2014-07-24 DIAGNOSIS — F17200 Nicotine dependence, unspecified, uncomplicated: Secondary | ICD-10-CM | POA: Insufficient documentation

## 2014-07-24 DIAGNOSIS — Z90710 Acquired absence of both cervix and uterus: Secondary | ICD-10-CM | POA: Diagnosis not present

## 2014-07-24 DIAGNOSIS — Z923 Personal history of irradiation: Secondary | ICD-10-CM | POA: Insufficient documentation

## 2014-07-24 DIAGNOSIS — L408 Other psoriasis: Secondary | ICD-10-CM | POA: Diagnosis not present

## 2014-07-24 DIAGNOSIS — C539 Malignant neoplasm of cervix uteri, unspecified: Secondary | ICD-10-CM | POA: Insufficient documentation

## 2014-07-24 DIAGNOSIS — F411 Generalized anxiety disorder: Secondary | ICD-10-CM | POA: Insufficient documentation

## 2014-07-24 NOTE — Patient Instructions (Signed)
Plan to follow up in three months with a pap smear at that time or sooner if issues/concerns arise.  Please call for any questions or concerns.

## 2014-07-24 NOTE — Progress Notes (Signed)
Consult Note: Gyn-Onc   Stacy Buck 33 y.o. female  Chief Complaint  Patient presents with  . Cervical Cancer    Assessment : Stage IB 1 squamous cell carcinoma cervix. Clinically NED.  Plan:    Return in 3 months.  Interval history: Patient returns today having recently completed a plan course of radiation therapy. Total doses 50.4 gray and 28 fractions completed on 07/09/2014. She apparently tolerated the radiation therapy well with minimal side effects.  Patient currently is doing well. She denies any GI or GU symptoms except for some urinary hesitancy. She denies any pelvic pain pressure or bleeding.. She denies any vaginal discharge or bleeding her appetite is good and her functional status is excellent.  HPI:  33 year old gravida 2 para 2 seen in consultation request of Dr. Christophe Buck regarding management of a newly diagnosed cervical carcinoma. Patient believes she had her last Pap smear in 2011. Recently she had a Pap smear showing atypical squamous cells and high-grade dysplasia cannot be ruled out. 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 In retrospect the patient reports she's had postcoital bleeding for several months. She denies any pelvic pain or any GI or GU symptoms. Functional status is excellent.  She had a CT scan obtained on [place] 7 that shows no evidence of metastatic disease  The patient underwent a radical hysterectomy and pelvic lymphadenectomy 03/31/2014. Final pathology showed a 4.5 cm lesion, deep cervical stromal invasion, and angiolymphatic involvement. Postoperative hole pelvis radiation therapy was recommended.   Review of Systems:10 point review of systems is negative except as noted in interval history.   Vitals: Blood pressure 158/96, pulse 84, temperature 98.8 F (37.1 C), temperature source Oral, resp. rate 20, height 5\' 1"  (1.549 m), weight 125 lb (56.7 kg), last menstrual period 03/31/2014.  Physical  Exam: General : The patient is a healthy woman in no acute distress.  HEENT: normocephalic, extraoccular movements normal; neck is supple without thyromegally  Lynphnodes: Supraclavicular and inguinal nodes not enlarged  Abdomen: Soft, non-tender, no ascites, no organomegally, no masses, no hernias Pfannenstiel incision is well-healed. Suprapubic site is healthy. Pelvic:  EGBUS: Normal female  Vagina: Normal, no lesions cuff is well-healed. Urethra and Bladder: Normal, non-tender  Cervix: Surgically absent Uterus surgically absent  Bi-manual examination: Non-tender; no adenxal masses or nodularity  Rectal: normal sphincter tone, no masses, no blood , there is no parametrial involvement Lower extremities: No edema or varicosities. Normal range of motion      No Known Allergies  Past Medical History  Diagnosis Date  . Cancer 03/11/2014    cervical cancer  . Anxiety   . Psoriasis     patches on elbows, knees, legs ,abdomen, buttocks.     Past Surgical History  Procedure Laterality Date  . Childbirth      x2 NVD- epidural anesthesia  . Radical hysterectomy N/A 03/31/2014    Procedure: EXPLORATORY LAPAROTOMY/RADICAL HYSTERECTOMY/LYMPHNODE DISECTION;  Surgeon: Stacy Chapel, MD;  Location: [facility];  Service: Gynecology;  Laterality: N/A;    Current Outpatient Prescriptions  Medication Sig Dispense Refill  . Multiple Vitamins-Minerals (MULTIVITAMIN GUMMIES ADULTS PO) Take 1 each by mouth daily.      Marland Kitchen venlafaxine XR (EFFEXOR-XR) 75 MG 24 hr capsule Take 75 mg by mouth daily with breakfast.      . ibuprofen (ADVIL,MOTRIN) 200 MG tablet Take 200-400 mg by mouth every 6 (six) hours as needed for mild pain or moderate pain.  No current facility-administered medications for this visit.    History   Social History  . Marital Status: Married    Spouse Name: N/A    Number of Children: 2  . Years of Education: N/A   Occupational History  . 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 Smoking status: Current Every Day Smoker -- 0.50 packs/day for 15 years    Types: Cigarettes  . Smokeless tobacco: Not on file     Comment: cut back to 5 cigs per day  . Alcohol Use: No  . Drug Use: No  . Sexual Activity: Yes   Other Topics Concern  . Not on file   Social History Narrative  . No narrative on file    Family History  Problem Relation Age of Onset  . Diabetes Father   . Coronary artery disease Father   . Stroke Maternal Grandfather   . Diabetes Paternal Grandmother       Stacy Chapel, MD 07/24/2014, 10:13 AM

## 2014-08-24 ENCOUNTER — Encounter: Payer: Self-pay | Admitting: Oncology

## 2014-08-27 ENCOUNTER — Encounter: Payer: Self-pay | Admitting: Radiation Oncology

## 2014-08-27 ENCOUNTER — Ambulatory Visit
Admission: RE | Admit: 2014-08-27 | Discharge: 2014-08-27 | Disposition: A | Discharge status: Home / Self Care | Payer: BC Managed Care – PPO | Source: Ambulatory Visit | Attending: Radiation Oncology | Admitting: Radiation Oncology

## 2014-08-27 VITALS — BP 153/80 | HR 76 | Temp 98.7°F | Ht 61.0 in | Wt 126.2 lb

## 2014-08-27 DIAGNOSIS — C539 Malignant neoplasm of cervix uteri, unspecified: Secondary | ICD-10-CM

## 2014-08-27 NOTE — Progress Notes (Signed)
Eran Younge here for follow up after treatment to her pelvis.  She denies pain.  She reports having hot flashes 5 times per day.  She is taking Effexor.  She reports having urgency and incontinence with urination that she has had since surgery.  She denies bowel issues and vaginal/rectal bleeding.  She denies nausea and reports her appetite is good.  She reports occasional fatigue due to work and her children.

## 2014-08-27 NOTE — Progress Notes (Signed)
  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  Her energy level has improved. She continues to work full-time and is busy with her children. She denies any nausea. Appetite is good. She occasionally will have some urinary urgency which has been present since her surgery. She does have hot flashes but is taking Effexor which seems to limit the side effects. She denies any problems with hot flashes awakening her at night.                              ALLERGIES:  has No Known Allergies.  Meds: Current Outpatient Prescriptions  Medication Sig Dispense Refill  . ibuprofen (ADVIL,MOTRIN) 200 MG tablet Take 200-400 mg by mouth every 6 (six) hours as needed for mild pain or moderate pain.      . Multiple Vitamins-Minerals (MULTIVITAMIN GUMMIES ADULTS PO) Take 1 each by mouth daily.      . nicotine (NICODERM CQ - DOSED IN MG/24 HOURS) 21 mg/24hr patch Place 21 mg onto the skin daily.      Marland Kitchen venlafaxine XR (EFFEXOR-XR) 75 MG 24 hr capsule Take 75 mg by mouth daily with breakfast.       No current facility-administered medications for this encounter.    Physical Findings: The patient is in no acute distress. Patient is alert and oriented.  height is 5\' 1"  (1.549 m) and weight is 126 lb 3.2 oz (57.244 kg). Her oral temperature is 98.7 F (37.1 C). Her blood pressure is 153/80 and her pulse is 76. Marland Kitchen  No palpable subclavicular or axillary adenopathy. The lungs are clear to auscultation. The heart has regular rhythm and rate. The abdomen is soft and nontender with normal bowel sounds. A pelvic exam is not  performed in light recent completion of treatment.  Lab Findings: Lab Results  Component Value Date   WBC 11.7* 04/02/2014   HGB 8.8* 04/02/2014   HCT 26.1* 04/02/2014   MCV 90.0 04/02/2014   PLT 202 04/02/2014      Radiographic Findings: No results found.  Impression:  The patient is recovering from the effects of radiation.    Plan:  Routine followup in February of 2016. The patient will be seen by gynecologic oncology in November of this year. Today the patient was given a vaginal dilator and instructions on its use in light of her pelvic radiation therapy.  ____________________________________ Blair Promise, MD

## 2014-08-27 NOTE — Progress Notes (Signed)
Barry was given a S+ and M vaginal dilator and Surgilube per Dr. Sondra Come.  She was instructed to apply Surgilube to the dilator, lie down and insert for 10 minutes three times a week (Monday, Wednesday, Friday) and to wash dilator afterwards with soap and water.  Ohana verbalized understanding.

## 2014-10-16 ENCOUNTER — Ambulatory Visit: Payer: BC Managed Care – PPO | Admitting: Gynecologic Oncology

## 2014-10-19 ENCOUNTER — Encounter: Payer: Self-pay | Admitting: Gynecologic Oncology

## 2014-10-19 ENCOUNTER — Other Ambulatory Visit (HOSPITAL_COMMUNITY)
Admission: RE | Admit: 2014-10-19 | Discharge: 2014-10-19 | Disposition: A | Discharge status: Home / Self Care | Payer: BC Managed Care – PPO | Source: Ambulatory Visit | Attending: Gynecologic Oncology | Admitting: Gynecologic Oncology

## 2014-10-19 ENCOUNTER — Ambulatory Visit: Payer: BC Managed Care – PPO | Attending: Gynecologic Oncology | Admitting: Gynecologic Oncology

## 2014-10-19 VITALS — BP 130/84 | HR 93 | Temp 98.2°F | Resp 18 | Ht 61.0 in | Wt 132.7 lb

## 2014-10-19 DIAGNOSIS — Z79899 Other long term (current) drug therapy: Secondary | ICD-10-CM | POA: Diagnosis not present

## 2014-10-19 DIAGNOSIS — Z9079 Acquired absence of other genital organ(s): Secondary | ICD-10-CM | POA: Insufficient documentation

## 2014-10-19 DIAGNOSIS — Z90710 Acquired absence of both cervix and uterus: Secondary | ICD-10-CM | POA: Insufficient documentation

## 2014-10-19 DIAGNOSIS — Z01411 Encounter for gynecological examination (general) (routine) with abnormal findings: Secondary | ICD-10-CM | POA: Diagnosis not present

## 2014-10-19 DIAGNOSIS — C539 Malignant neoplasm of cervix uteri, unspecified: Secondary | ICD-10-CM | POA: Diagnosis present

## 2014-10-19 DIAGNOSIS — N930 Postcoital and contact bleeding: Secondary | ICD-10-CM | POA: Diagnosis not present

## 2014-10-19 DIAGNOSIS — Z124 Encounter for screening for malignant neoplasm of cervix: Secondary | ICD-10-CM | POA: Insufficient documentation

## 2014-10-19 DIAGNOSIS — F17210 Nicotine dependence, cigarettes, uncomplicated: Secondary | ICD-10-CM | POA: Insufficient documentation

## 2014-10-19 DIAGNOSIS — L409 Psoriasis, unspecified: Secondary | ICD-10-CM | POA: Insufficient documentation

## 2014-10-19 DIAGNOSIS — Z720 Tobacco use: Secondary | ICD-10-CM

## 2014-10-19 DIAGNOSIS — Z8541 Personal history of malignant neoplasm of cervix uteri: Secondary | ICD-10-CM

## 2014-10-19 NOTE — Progress Notes (Signed)
Follow Up Note: Gyn-Onc  Stacy Buck 33 y.o. female  CC:  Chief Complaint  Patient presents with  . Cervical Cancer    HPI:  Stacy Buck is a 33 year old, gravida 2 para 2, seen in consultation at the request of Dr. Christophe Louis regarding management of a newly diagnosed cervical carcinoma. Patient believes she had her last Pap smear in 2011. Recently she had a Pap smear showing atypical squamous cells and high-grade dysplasia cannot be ruled out.  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  In retrospect, the patient reports having postcoital bleeding for several months. She denies any pelvic pain or any GI or GU symptoms. Functional status is excellent.  She had a CT scan obtained on L[place] 7 that shows no evidence of metastatic disease.    On 03/31/14, she underwent a radical abdominal hysterectomy, bilateral salpingectomy, bilateral pelvic lymphadenectomy, placement of suprapubic catheter, bilateral oophorpexy by Dr. Fermin Schwab.  Her post-operative course was uneventful.  Final pathology revealed:  1. 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 - VAGINAL RESECTION MARGIN, NEGATIVE FOR DYSPLASIA OR MALIGNANCY. - PARAMETRIAL TISSUE: FIVE LYMPH NODES AND BENIGN ADIPOSE TISSUE, NO EVIDENCE OF MALIGNANCY, (0/5). 2. Lymph nodes, regional resection, left pelvic - EIGHT LYMPH NODES, NEGATIVE FOR METASTATIC CARCINOMA (0/8). 3. Lymph nodes, regional resection, right pelvic - SIX LYMPH NODES, NEGATIVE FOR METASTATIC CARCINOMA (0/6).  Radiation therapy was recommended post-operatively and she completed total doses 50.4 gray and 28 fractions on 07/09/2014 under the care of Dr. Sondra Come. Radiation was tolerated well with minimal side effects.   Interval History:  She presents today by herself for follow up.  She  reports doing well except for hot flashes throughout the day.  She reports having five to six hot flashes during the day with none reported at night.  She states they are tolerable and not interfering with her quality of life.  Adequate PO intake reported.  Bladder functioning without difficulty.  Intermittent constipation reported that resolves without medication.  She states she continues to smoke half a pack a day but is ready to quit.  She states she has not stopped due to stress in her life.  She had tried using the nicotine patch but it made her feel sick so she discontinued use.  No other methods used.  Denies pain or vaginal bleeding.  See below for more detailed review of systems.  No concerns voiced.    Review of Systems Constitutional: Feels well.  No fever, chills, early satiety, change in appetite, or unintentional weight loss or gain. Cardiovascular: No chest pain, shortness of breath, or edema.  Pulmonary: No cough or wheeze.  Gastrointestinal: No nausea, vomiting, or diarrhea. No bright red blood per rectum or change in bowel movement.  Genitourinary: No frequency, urgency, or dysuria. No vaginal bleeding or discharge.  Musculoskeletal: No myalgia or joint pain. Neurologic: No weakness, numbness, or change in gait.  Psychology: No depression, anxiety, or insomnia.  Current Meds:  Outpatient Encounter Prescriptions as of 10/19/2014  Medication Sig  . ibuprofen (ADVIL,MOTRIN) 200 MG tablet Take 200-400 mg by mouth every 6 (six) hours as needed for mild pain or moderate pain.  . Multiple Vitamins-Minerals (MULTIVITAMIN GUMMIES ADULTS PO) Take 1 each by mouth daily.  Marland Kitchen venlafaxine XR (EFFEXOR-XR) 75 MG 24 hr capsule Take 75 mg by mouth daily with breakfast.  . [  DISCONTINUED] nicotine (NICODERM CQ - DOSED IN MG/24 HOURS) 21 mg/24hr patch Place 21 mg onto the skin daily.    Allergy: No Known Allergies  Social Hx:   History   Social History  . Marital Status: Married    Spouse  Name: N/A    Number of Children: 2  . 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 Smoking status: Current Every Day Smoker -- 0.50 packs/day for 15 years    Types: Cigarettes  . Smokeless tobacco: Not on file     Comment: cut back to 5 cigs per day  . Alcohol Use: No  . Drug Use: No  . Sexual Activity: Yes   Other Topics Concern  . Not on file   Social History Narrative    Past Surgical Hx:  Past Surgical History  Procedure Laterality Date  . Childbirth      x2 NVD- epidural anesthesia  . Radical hysterectomy N/A 03/31/2014    Procedure: EXPLORATORY LAPAROTOMY/RADICAL HYSTERECTOMY/LYMPHNODE DISECTION;  Surgeon: Alvino Chapel, MD;  Location: [facility];  Service: Gynecology;  Laterality: N/A;    Past Medical Hx:  Past Medical History  Diagnosis Date  . Cancer 03/11/2014    cervical cancer  . Anxiety   . Psoriasis     patches on elbows, knees, legs ,abdomen, buttocks.   . Radiation 06/01/14-07/09/14    pelvis 50.4 gray    Family Hx:  Family History  Problem Relation Age of Onset  . Diabetes Father   . Coronary artery disease Father   . Stroke Maternal Grandfather   . Diabetes Paternal Grandmother     Vitals:  Blood pressure 130/84, pulse 93, temperature 98.2 F (36.8 C), temperature source Oral, resp. rate 18, height 5\' 1"  (1.549 m), weight 132 lb 11.2 oz (60.192 kg), last menstrual period 03/31/2014.  Physical Exam:  General: Well developed, well nourished female in no acute distress. Alert and oriented x 3.  Neck: Supple without any enlargements.  Lymph node survey: No cervical, supraclavicular, or inguinal adenopathy.  Cardiovascular: Regular rate and rhythm. S1 and S2 normal.  Lungs: Clear to auscultation bilaterally. No wheezes/crackles/rhonchi noted.  Skin: No rashes or lesions present. Back: No CVA tenderness.  Abdomen: Abdomen soft, non-tender and non-obese. Active bowel sounds in  all quadrants. No evidence of a fluid wave or abdominal masses.  Genitourinary:    Vulva/vagina: Normal external female genitalia. No lesions.    Urethra: No lesions or masses.    Vagina: No palpable masses or lesions. No vaginal bleeding or drainage noted.  ThinPrep Pap obtained without difficulty.  Rectal: Good tone, no masses no cul de sac nodularity.  Extremities: No bilateral cyanosis, edema, or clubbing.   Assessment/Plan:  33 year old female s/p radical abdominal hysterectomy, bilateral salpingectomy, bilateral pelvic lymphadenectomy, placement of suprapubic catheter, bilateral oophorpexy on 03/31/14 by Dr. 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  Due to the intermediate risk features (size of 4.5 cm, depth of invasion of 21 mm in depth with cervical thickness of 28 mm, and LVSI), whole pelvis radiation was recommended by Dr. Fermin Schwab.  She completed radiation on 07/09/2014 under the care of Dr. Gery Pray.  She is without evidence of disease at this time.  We will contact her with the results of her pap smear from today.  She is advised to continue taking effexor as  prescribed since it could be lessening vasomotor symptoms as well.  She is instructed to continue use of her vaginal dilator.  Smoking cessation material given.  She is to call the office if her vasomotor symptoms become intolerable or begin to interfere with her quality of life.  She will plan to follow up with Dr. Sondra Come in Feb 2016 and Dr. Fermin Schwab in May 2016 or sooner if needed.  She is advised to call for any questions or concerns.  Reportable signs and symptoms reviewed.         Favor Kreh DEAL, NP 10/19/2014, 11:44 AM

## 2014-10-19 NOTE — Patient Instructions (Signed)
We will contact you with the results of your pap smear from today.  Plan to follow up with Dr. Sondra Come in Feb 2016 and GYN Oncology three months after or sooner if needed.  Please call for any questions or concerns.

## 2014-10-22 LAB — CYTOLOGY - PAP

## 2014-10-23 ENCOUNTER — Telehealth: Payer: Self-pay | Admitting: Gynecologic Oncology

## 2014-10-23 NOTE — Telephone Encounter (Signed)
Message left for patient with pap smear results: negative.  Instructed to call for any questions or concerns.  

## 2015-01-21 ENCOUNTER — Other Ambulatory Visit (HOSPITAL_COMMUNITY)
Admission: RE | Admit: 2015-01-21 | Discharge: 2015-01-21 | Disposition: A | Discharge status: Home / Self Care | Payer: BLUE CROSS/BLUE SHIELD | Source: Ambulatory Visit | Attending: Radiation Oncology | Admitting: Radiation Oncology

## 2015-01-21 ENCOUNTER — Encounter: Payer: Self-pay | Admitting: Radiation Oncology

## 2015-01-21 ENCOUNTER — Ambulatory Visit
Admission: RE | Admit: 2015-01-21 | Discharge: 2015-01-21 | Disposition: A | Discharge status: Home / Self Care | Payer: BLUE CROSS/BLUE SHIELD | Source: Ambulatory Visit | Attending: Radiation Oncology | Admitting: Radiation Oncology

## 2015-01-21 VITALS — BP 148/83 | HR 82 | Temp 98.2°F | Resp 12 | Ht 61.0 in | Wt 139.6 lb

## 2015-01-21 DIAGNOSIS — C539 Malignant neoplasm of cervix uteri, unspecified: Secondary | ICD-10-CM

## 2015-01-21 DIAGNOSIS — Z01411 Encounter for gynecological examination (general) (routine) with abnormal findings: Secondary | ICD-10-CM | POA: Insufficient documentation

## 2015-01-21 MED ORDER — NEOMYCIN-POLYMYXIN-HC 3.5-10000-1 OT SOLN: 4.0000 [drp] | Freq: Four times a day (QID) | OTIC | Status: DC

## 2015-01-21 NOTE — Progress Notes (Signed)
Stacy Buck here for follow up after treatment for cervical cancer.  She denies pain, bowel issues, nausea, and vaginal/rectal bleeding.  She reports urinary urgency with occasional incontinence.  She reports fatigue.  She also reports having a cold last week that went away and now has right ear pain.  BP 148/83 mmHg  Pulse 82  Temp(Src) 98.2 F (36.8 C) (Oral)  Resp 12  Ht 5\' 1"  (1.549 m)  Wt 139 lb 9.6 oz (63.322 kg)  BMI 26.39 kg/m2  LMP 03/31/2014

## 2015-01-21 NOTE — Progress Notes (Signed)
  Radiation Oncology         [phone]) [phone] ________________________________  Name: Stacy Buck MRN: [id number]  Date: 01/21/2015  DOB: [birth year]  Follow-Up Visit Note  CC: HEPLER,MARK, PA-C  Clarke-Pearson, Daniel *    ICD-9-CM ICD-10-CM   1. Cervix cancer 180.9 C53.9 Cytology - PAP    Diagnosis: Stage IB-1 squamous cell carcinoma cervix with deep cervical stromal invasion and angiolymphatic involvement  Interval Since Last Radiation:  6  months  Narrative:  The patient returns today for routine follow-up.  She denies any new problems. She continues have some occasional urinary incontinence but is managing this well. She denies any hematuria dysuria or rectal bleeding or pelvic. She continues to use her vaginal dilator. No bleeding with intercourse.   Patient did have a head cold earlier this year and since that time has had pain in her right ear. She denies any drainage or bleeding from her right ear or hearing problems.  No fever                   ALLERGIES:  has No Known Allergies.  Meds: Current Outpatient Prescriptions  Medication Sig Dispense Refill  . ibuprofen (ADVIL,MOTRIN) 200 MG tablet Take 200-400 mg by mouth every 6 (six) hours as needed for mild pain or moderate pain.    . Multiple Vitamins-Minerals (MULTIVITAMIN GUMMIES ADULTS PO) Take 1 each by mouth daily.    Marland Kitchen venlafaxine XR (EFFEXOR-XR) 75 MG 24 hr capsule Take 75 mg by mouth daily with breakfast.    . neomycin-polymyxin-hydrocortisone (CORTISPORIN) otic solution Place 4 drops into the right ear 4 (four) times daily. 10 mL 0   No current facility-administered medications for this encounter.    Physical Findings: The patient is in no acute distress. Patient is alert and oriented.  height is 5\' 1"  (1.549 m) and weight is 139 lb 9.6 oz (63.322 kg). Her oral temperature is 98.2 F (36.8 C). Her blood pressure is 148/83 and her pulse is 82. Her respiration is 12. .  No palpable subclavicular or axillary adenopathy.  Lungs are clear to auscultation. The heart has a regular rhythm and rate. The abdomen is soft and nontender with normal bowel sounds. No inguinal adenopathy appreciated. On pelvic examination the external genitalia are unremarkable. Speculum exam is performed. There is mild radiation changes noted in the proximal vagina. No mucosal lesions. A Pap smear was obtained of the proximal vagina. On bimanual and rectovaginal examination there no pelvic masses appreciated.  The right external canal is somewhat erythematous and very tender with otoscope examination. The tympanic membrane is benign  Lab Findings: Lab Results  Component Value Date   WBC 11.7* 04/02/2014   HGB 8.8* 04/02/2014   HCT 26.1* 04/02/2014   MCV 90.0 04/02/2014   PLT 202 04/02/2014    Radiographic Findings: No results found.  Impression:  No evidence of recurrence on clinical exam today, Pap smear pending.  Probable external otitis of the right year  Plan:  Routine follow-up in 6 months. In the interim the patient will be seen by gynecologic oncology. Patient is been given a prescription for Cortisporin otic toplace  in the right ear canal for 7 days. If this does not clear up her symptoms then she will need to see her primary care physician.  ____________________________________ Blair Promise, MD

## 2015-01-25 LAB — CYTOLOGY - PAP

## 2015-01-27 ENCOUNTER — Telehealth: Payer: Self-pay | Admitting: Oncology

## 2015-01-27 NOTE — Telephone Encounter (Signed)
Notified Stacy Buck of her good pap smear results per Dr. Sondra Come.  She verbalized understanding.

## 2015-04-15 ENCOUNTER — Encounter: Payer: Self-pay | Admitting: Gynecology

## 2015-04-15 ENCOUNTER — Ambulatory Visit: Payer: BLUE CROSS/BLUE SHIELD | Attending: Gynecology | Admitting: Gynecology

## 2015-04-15 ENCOUNTER — Other Ambulatory Visit (HOSPITAL_COMMUNITY)
Admission: RE | Admit: 2015-04-15 | Discharge: 2015-04-15 | Disposition: A | Discharge status: Home / Self Care | Payer: BLUE CROSS/BLUE SHIELD | Source: Ambulatory Visit | Attending: Gynecology | Admitting: Gynecology

## 2015-04-15 VITALS — BP 130/73 | HR 72 | Temp 98.0°F | Resp 20 | Ht 61.0 in | Wt 145.2 lb

## 2015-04-15 DIAGNOSIS — C539 Malignant neoplasm of cervix uteri, unspecified: Secondary | ICD-10-CM

## 2015-04-15 DIAGNOSIS — Z8541 Personal history of malignant neoplasm of cervix uteri: Secondary | ICD-10-CM

## 2015-04-15 DIAGNOSIS — N951 Menopausal and female climacteric states: Secondary | ICD-10-CM

## 2015-04-15 DIAGNOSIS — Z01411 Encounter for gynecological examination (general) (routine) with abnormal findings: Secondary | ICD-10-CM | POA: Insufficient documentation

## 2015-04-15 MED ORDER — ESTRADIOL 0.05 MG/24HR TD PTTW: 1.0000 | MEDICATED_PATCH | TRANSDERMAL | Status: DC

## 2015-04-15 NOTE — Progress Notes (Signed)
Consult Note: Gyn-Onc   Stacy Buck 34 y.o. female  Chief Complaint  Patient presents with  . Cervical Cancer    Assessment : Stage IB 1 squamous cell carcinoma cervix. Clinically NED. Menopausal symptoms  Plan:    Pap smears are obtained today. The patient will continue to take Effexor. She is given a prescription for Vivelle-Dot 0.05 mg per day to be changed twice a week. Return in 6 months.  Interval history: Patient returns today as previously scheduled. Patient currently is doing well. She denies any GI symptoms except for some urinary hesitancy. She denies any pelvic pain pressure or bleeding.. She denies any vaginal discharge or bleeding her appetite is good and her functional status is excellent.  Remaining concern is that she is having hot flushes despite the fact she takes Effexor 75 mg daily at bedtime.  HPI:  34 year old gravida 2 para 2 seen in consultation request of Dr. Christophe Louis regarding management of a newly diagnosed cervical carcinoma. Patient believes she had her last Pap smear in 2011. Recently she had a Pap smear showing atypical squamous cells and high-grade dysplasia cannot be ruled out. 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 In retrospect the patient reports she's had postcoital bleeding for several months. She denies any pelvic pain or any GI or GU symptoms. Functional status is excellent.  She had a CT scan obtained on Jacklyn 7 that shows no evidence of metastatic disease  The patient underwent a radical hysterectomy and pelvic lymphadenectomy 03/31/2014. Final pathology showed a 4.5 cm lesion, deep cervical stromal invasion, and angiolymphatic involvement. She received postoperative whole pelvis radiation therapy. Total doses 50.4 gray and 28 fractions completed on 07/09/2014. She tolerated the radiation therapy well with minimal side effects.    Review of Systems:10 point review of systems is negative except as  noted in interval history.   Vitals: Blood pressure 130/73, pulse 72, temperature 98 F (36.7 C), temperature source Oral, resp. rate 20, height 5\' 1"  (1.549 m), weight 145 lb 3.2 oz (65.862 kg), last menstrual period 03/31/2014.  Physical Exam: General : The patient is a healthy woman in no acute distress.  HEENT: normocephalic, extraoccular movements normal; neck is supple without thyromegally  Lynphnodes: Supraclavicular and inguinal nodes not enlarged  Abdomen: Soft, non-tender, no ascites, no organomegally, no masses, no hernias Pfannenstiel incision is well-healed.  Pelvic:  EGBUS: Normal female  Vagina: Normal, no lesions cuff is well-healed. Urethra and Bladder: Normal, non-tender  Cervix: Surgically absent Uterus surgically absent  Bi-manual examination: Non-tender; no adenxal masses or nodularity  Rectal: normal sphincter tone, no masses, no blood , there is no parametrial involvement Lower extremities: No edema or varicosities. Normal range of motion      No Known Allergies  Past Medical History  Diagnosis Date  . Cancer 03/11/2014    cervical cancer  . Anxiety   . Psoriasis     patches on elbows, knees, legs ,abdomen, buttocks.   . Radiation 06/01/14-07/09/14    pelvis 50.4 gray    Past Surgical History  Procedure Laterality Date  . Childbirth      x2 NVD- epidural anesthesia  . Radical hysterectomy N/A 03/31/2014    Procedure: EXPLORATORY LAPAROTOMY/RADICAL HYSTERECTOMY/LYMPHNODE DISECTION;  Surgeon: Alvino Chapel, MD;  Location: [facility];  Service: Gynecology;  Laterality: N/A;    Current Outpatient Prescriptions  Medication Sig Dispense Refill  . ibuprofen (ADVIL,MOTRIN) 200 MG tablet Take 200-400 mg by mouth every 6 (six) hours as  needed for mild pain or moderate pain.    . Multiple Vitamins-Minerals (MULTIVITAMIN GUMMIES ADULTS PO) Take 1 each by mouth daily.    Marland Kitchen venlafaxine XR (EFFEXOR-XR) 75 MG 24 hr capsule Take 75 mg by mouth daily with  breakfast.     No current facility-administered medications for this visit.    History   Social History  . Marital Status: Married    Spouse Name: N/A  . Number of Children: 2  . 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 Smoking status: Current Every Day Smoker -- 0.50 packs/day for 15 years    Types: Cigarettes  . Smokeless tobacco: Not on file     Comment: cut back to 5 cigs per day  . Alcohol Use: No  . Drug Use: No  . Sexual Activity: Yes   Other Topics Concern  . Not on file   Social History Narrative    Family History  Problem Relation Age of Onset  . Diabetes Father   . Coronary artery disease Father   . Stroke Maternal Grandfather   . Diabetes Paternal Grandmother       Alvino Chapel, MD 04/15/2015, 10:50 AM

## 2015-04-15 NOTE — Addendum Note (Signed)
Addended by: Joylene John D on: 04/15/2015 12:04 PM   Modules accepted: Orders

## 2015-04-15 NOTE — Patient Instructions (Addendum)
We will call you with pap smear results and plan to followup with Dr. Fermin Schwab in 6 months as scheduled. Please call us sooner with any questions or concerns.   Kegel Exercises The goal of Kegel exercises is to isolate and exercise your pelvic floor muscles. These muscles act as a hammock that supports the rectum, vagina, small intestine, and uterus. As the muscles weaken, the hammock sags and these organs are displaced from their normal positions. Kegel exercises can strengthen your pelvic floor muscles and help you to improve bladder and bowel control, improve sexual response, and help reduce many problems and some discomfort during pregnancy. Kegel exercises can be done anywhere and at any time. HOW TO PERFORM KEGEL EXERCISES 1. Locate your pelvic floor muscles. To do this, squeeze (contract) the muscles that you use when you try to stop the flow of urine. You will feel a tightness in the vaginal area (women) and a tight lift in the rectal area (men and women). 2. When you begin, contract your pelvic muscles tight for 2-5 seconds, then relax them for 2-5 seconds. This is one set. Do 4-5 sets with a short pause in between. 3. Contract your pelvic muscles for 8-10 seconds, then relax them for 8-10 seconds. Do 4-5 sets. If you cannot contract your pelvic muscles for 8-10 seconds, try 5-7 seconds and work your way up to 8-10 seconds. Your goal is 4-5 sets of 10 contractions each day. Keep your stomach, buttocks, and legs relaxed during the exercises. Perform sets of both short and long contractions. Vary your positions. Perform these contractions 3-4 times per day. Perform sets while you are:   Lying in bed in the morning.  Standing at lunch.  Sitting in the late afternoon.  Lying in bed at night. You should do 40-50 contractions per day. Do not perform more Kegel exercises per day than recommended. Overexercising can cause muscle fatigue. Continue these exercises for for at least 15-20 weeks  or as directed by your caregiver. Document Released: 11/13/2012 Document Reviewed: 11/13/2012 [facility] Patient Information 2015 [place]. This information is not intended to replace advice given to you by your health care provider. Make sure you discuss any questions you have with your health care provider.

## 2015-04-19 ENCOUNTER — Telehealth: Payer: Self-pay | Admitting: *Deleted

## 2015-04-19 LAB — CYTOLOGY - PAP

## 2015-04-19 NOTE — Telephone Encounter (Signed)
Per Joylene John, NP, patient notified of normal pap smear. Patient told to call with any further questions or concerns.

## 2015-07-22 ENCOUNTER — Ambulatory Visit
Admission: RE | Admit: 2015-07-22 | Discharge: 2015-07-22 | Disposition: A | Discharge status: Home / Self Care | Payer: BLUE CROSS/BLUE SHIELD | Source: Ambulatory Visit | Attending: Radiation Oncology | Admitting: Radiation Oncology

## 2015-07-22 ENCOUNTER — Encounter: Payer: Self-pay | Admitting: Radiation Oncology

## 2015-07-22 VITALS — BP 136/86 | HR 81 | Temp 98.5°F | Resp 12 | Ht 61.0 in | Wt 147.7 lb

## 2015-07-22 DIAGNOSIS — C539 Malignant neoplasm of cervix uteri, unspecified: Secondary | ICD-10-CM

## 2015-07-22 NOTE — Progress Notes (Signed)
Stacy Buck here for follow up.  She denies pain except for tension in her right shoulder.  She reports she is having urinary urgency and incontinence at night.  She said this has been happening since her surgery and says it is getting better.  She reports having hot flashes and thinks her estrogen patch strength needs to be increased.  She denies having bowel issues and vaginal/rectal bleeding.  She reports having fatigue.  She is using her vaginal dilator.  BP 136/86 mmHg  Pulse 81  Temp(Src) 98.5 F (36.9 C) (Oral)  Resp 12  Ht 5\' 1"  (1.549 m)  Wt 147 lb 11.2 oz (66.996 kg)  BMI 27.92 kg/m2  LMP 03/31/2014

## 2015-07-22 NOTE — Progress Notes (Signed)
  Radiation Oncology         [phone]) [phone] ________________________________  Name: Enes Dibuono MRN: [id number]  Date: 07/22/2015  DOB: [birth year]    Follow-Up Visit Note  CC: HEPLER,MARK, PA-C  Clarke-Pearson, Daniel,*   Diagnosis: Stage IB-1 squamous cell carcinoma cervix with deep cervical stromal invasion and angiolymphatic involvement  Interval Since Last Radiation:  1  year  Narrative:  The patient returns today for routine follow-up.  She denies pain except for tension in her right shoulder. She denies any unusual lifting or exercising. She reports she is having urinary urgency and incontinence at night. She said this has been happening since her surgery and says it is getting better.  She reports having hot flashes and thinks her estrogen patch strength needs to be increased. She becomes nauseous when she gets warm. She denies having bowel issues and vaginal/rectal bleeding. She reports having fatigue. She is using her vaginal dilator and denies any bleeding while using it. She had a pap smear performed 3 months ago with Dr. Fermin Schwab.                  ALLERGIES:  has No Known Allergies.  Meds: Current Outpatient Prescriptions  Medication Sig Dispense Refill  . betamethasone dipropionate (DIPROLENE) 0.05 % cream APPLY TO AFFECTED AREA EVERY DAY  0  . estradiol (CLIMARA - DOSED IN MG/24 HR) 0.075 mg/24hr patch   0  . ibuprofen (ADVIL,MOTRIN) 200 MG tablet Take 200-400 mg by mouth every 6 (six) hours as needed for mild pain or moderate pain.    . Multiple Vitamins-Minerals (MULTIVITAMIN GUMMIES ADULTS PO) Take 1 each by mouth daily.    Marland Kitchen venlafaxine XR (EFFEXOR-XR) 75 MG 24 hr capsule Take 75 mg by mouth daily with breakfast.     No current facility-administered medications for this encounter.    Physical Findings: The patient is in no acute distress. Patient is alert and oriented.  height is 5\' 1"  (1.549 m) and weight is 147 lb 11.2 oz (66.996 kg). Her oral temperature  is 98.5 F (36.9 C). Her blood pressure is 136/86 and her pulse is 81. Her respiration is 12. .  No palpable subclavicular or axillary adenopathy. Lungs are clear to auscultation. The heart has a regular rhythm and rate. The abdomen is soft and nontender with normal bowel sounds. No inguinal adenopathy appreciated. On pelvic examination the external genitalia are unremarkable. Speculum exam is performed. There is mild radiation changes noted in the proximal vagina. No mucosal lesions. On bimanual and rectovaginal examination there no pelvic masses appreciated.  Lab Findings: Lab Results  Component Value Date   WBC 11.7* 04/02/2014   HGB 8.8* 04/02/2014   HCT 26.1* 04/02/2014   MCV 90.0 04/02/2014   PLT 202 04/02/2014    Radiographic Findings: No results found.  Impression:  No evidence of recurrence on clinical exam today, pap smear not done according to [facility] recommendations. She had a normal Pap smear approximately 3 months ago.  Plan:  Routine follow-up in 6 months. In the interim the patient will be seen by gynecologic oncology.  This document serves as a record of services personally performed by Gery Pray, MD. It was created on his behalf by Arlyce Harman, a trained medical scribe. The creation of this record is based on the scribe's personal observations and the provider's statements to them. This document has been checked and approved by the attending provider.  -----------------------------------  Blair Promise, PhD, MD   ll

## 2015-10-14 ENCOUNTER — Ambulatory Visit: Payer: BLUE CROSS/BLUE SHIELD | Admitting: Gynecology

## 2015-10-24 ENCOUNTER — Emergency Department (HOSPITAL_BASED_OUTPATIENT_CLINIC_OR_DEPARTMENT_OTHER)
Admission: EM | Admit: 2015-10-24 | Discharge: 2015-10-24 | Disposition: A | Discharge status: Home / Self Care | Payer: BLUE CROSS/BLUE SHIELD | Attending: Emergency Medicine | Admitting: Emergency Medicine

## 2015-10-24 ENCOUNTER — Emergency Department (HOSPITAL_BASED_OUTPATIENT_CLINIC_OR_DEPARTMENT_OTHER): Payer: BLUE CROSS/BLUE SHIELD

## 2015-10-24 ENCOUNTER — Encounter (HOSPITAL_BASED_OUTPATIENT_CLINIC_OR_DEPARTMENT_OTHER): Payer: Self-pay | Admitting: *Deleted

## 2015-10-24 DIAGNOSIS — F419 Anxiety disorder, unspecified: Secondary | ICD-10-CM | POA: Diagnosis not present

## 2015-10-24 DIAGNOSIS — Y9389 Activity, other specified: Secondary | ICD-10-CM | POA: Insufficient documentation

## 2015-10-24 DIAGNOSIS — X58XXXA Exposure to other specified factors, initial encounter: Secondary | ICD-10-CM | POA: Diagnosis not present

## 2015-10-24 DIAGNOSIS — F17210 Nicotine dependence, cigarettes, uncomplicated: Secondary | ICD-10-CM | POA: Insufficient documentation

## 2015-10-24 DIAGNOSIS — S93402A Sprain of unspecified ligament of left ankle, initial encounter: Secondary | ICD-10-CM | POA: Insufficient documentation

## 2015-10-24 DIAGNOSIS — S99912A Unspecified injury of left ankle, initial encounter: Secondary | ICD-10-CM | POA: Diagnosis present

## 2015-10-24 DIAGNOSIS — Z79899 Other long term (current) drug therapy: Secondary | ICD-10-CM | POA: Insufficient documentation

## 2015-10-24 DIAGNOSIS — S99922A Unspecified injury of left foot, initial encounter: Secondary | ICD-10-CM | POA: Insufficient documentation

## 2015-10-24 DIAGNOSIS — Y9289 Other specified places as the place of occurrence of the external cause: Secondary | ICD-10-CM | POA: Diagnosis not present

## 2015-10-24 DIAGNOSIS — Z872 Personal history of diseases of the skin and subcutaneous tissue: Secondary | ICD-10-CM | POA: Diagnosis not present

## 2015-10-24 DIAGNOSIS — Y998 Other external cause status: Secondary | ICD-10-CM | POA: Insufficient documentation

## 2015-10-24 DIAGNOSIS — Z8541 Personal history of malignant neoplasm of cervix uteri: Secondary | ICD-10-CM | POA: Diagnosis not present

## 2015-10-24 MED ORDER — IBUPROFEN 600 MG PO TABS: 600.0000 mg | ORAL_TABLET | Freq: Four times a day (QID) | ORAL | Status: AC | PRN

## 2015-10-24 NOTE — ED Notes (Signed)
Pt reports she twisted her left ankle 1 month ago. States pain is getting worse. Requesting xray and requests no narcotic pain medication

## 2015-10-24 NOTE — ED Provider Notes (Signed)
CSN: [id number]     Arrival date & time 10/24/15  1118 History   First MD Initiated Contact with Patient 10/24/15 1255     Chief Complaint  Patient presents with  . Foot Pain     (Consider location/radiation/quality/duration/timing/severity/associated sxs/prior Treatment) HPI Comments: 34 y.o. Female with history of CA, anxiety, psoriasis presents for left foot/ankle pain.  The patient reports that she has sprained her ankle many times but that about 1 month ago she rolled her foot while walking and has had pain in the arch of her foot as well as around the ankle since that time.  Denies new or more recent injury.  Normal sensation and strength.  Patient has still been ambulatory although she has pain with bearing weight on the foot.   Past Medical History  Diagnosis Date  . Cancer ([place]) 03/11/2014    cervical cancer  . Anxiety   . Psoriasis     patches on elbows, knees, legs ,abdomen, buttocks.   . Radiation 06/01/14-07/09/14    pelvis 50.4 gray   Past Surgical History  Procedure Laterality Date  . Childbirth      x2 NVD- epidural anesthesia  . Radical hysterectomy N/A 03/31/2014    Procedure: EXPLORATORY LAPAROTOMY/RADICAL HYSTERECTOMY/LYMPHNODE DISECTION;  Surgeon: Alvino Chapel, MD;  Location: [facility];  Service: Gynecology;  Laterality: N/A;  . Abdominal hysterectomy     Family History  Problem Relation Age of Onset  . Diabetes Father   . Coronary artery disease Father   . Stroke Maternal Grandfather   . Diabetes Paternal Grandmother    Social History  Substance Use Topics  . Smoking status: Current Every Day Smoker -- 0.50 packs/day for 15 years    Types: Cigarettes  . Smokeless tobacco: None     Comment: cut back to 5 cigs per day  . Alcohol Use: No   OB History    No data available     Review of Systems  Constitutional: Negative for fever, chills, appetite change and fatigue.  HENT: Negative for congestion and postnasal drip.   Respiratory: Negative  for cough, chest tightness and shortness of breath.   Cardiovascular: Negative for chest pain and palpitations.  Gastrointestinal: Negative for nausea, vomiting, abdominal pain and diarrhea.  Genitourinary: Negative for dysuria and urgency.  Musculoskeletal: Positive for arthralgias. Negative for myalgias and back pain.  Neurological: Negative for dizziness, weakness, numbness and headaches.  Hematological: Does not bruise/bleed easily.      Allergies  Review of patient's allergies indicates no known allergies.  Home Medications   Prior to Admission medications   Medication Sig Start Date End Date Taking? Authorizing Provider  betamethasone dipropionate (DIPROLENE) 0.05 % cream APPLY TO AFFECTED AREA EVERY DAY 06/21/15  Yes Historical Provider, MD  estradiol (CLIMARA - DOSED IN MG/24 HR) 0.075 mg/24hr patch  07/16/15  Yes Historical Provider, MD  Multiple Vitamins-Minerals (MULTIVITAMIN GUMMIES ADULTS PO) Take 1 each by mouth daily.   Yes Historical Provider, MD  venlafaxine XR (EFFEXOR-XR) 75 MG 24 hr capsule Take 75 mg by mouth daily with breakfast. 02/10/14  Yes Historical Provider, MD  ibuprofen (ADVIL,MOTRIN) 600 MG tablet Take 1 tablet (600 mg total) by mouth every 6 (six) hours as needed. 10/24/15   Harvel Quale, MD   BP 136/76 mmHg  Pulse 71  Temp(Src) 98.5 F (36.9 C) (Oral)  Resp 18  Ht 5\' 1"  (1.549 m)  Wt 140 lb (63.504 kg)  BMI 26.47 kg/m2  SpO2 100%  LMP  03/31/2014 Physical Exam  Constitutional: She is oriented to person, place, and time. She appears well-developed and well-nourished. No distress.  HENT:  Head: Normocephalic and atraumatic.  Right Ear: External ear normal.  Left Ear: External ear normal.  Nose: Nose normal.  Mouth/Throat: Oropharynx is clear and moist. No oropharyngeal exudate.  Eyes: EOM are normal. Pupils are equal, round, and reactive to light.  Neck: Normal range of motion. Neck supple.  Cardiovascular: Normal rate, regular rhythm, normal  heart sounds and intact distal pulses.   No murmur heard. Pulmonary/Chest: Effort normal. No respiratory distress. She has no wheezes. She has no rales.  Abdominal: Soft. She exhibits no distension. There is no tenderness.  Musculoskeletal: Normal range of motion. She exhibits no edema.       Right hip: Normal.       Left hip: Normal.       Right knee: Normal.       Left knee: Normal.       Right ankle: Normal.       Left ankle: Normal.       Left foot: There is tenderness (over the areas depicted in red on the diagram). There is normal range of motion, no bony tenderness, no swelling, normal capillary refill, no crepitus, no deformity and no laceration.       Feet:  Neurological: She is alert and oriented to person, place, and time.  Skin: Skin is warm and dry. No rash noted. She is not diaphoretic.  Vitals reviewed.   ED Course  Procedures (including critical care time) Labs Review Labs Reviewed - No data to display  Imaging Review Dg Ankle Complete Left  10/24/2015  CLINICAL DATA:  Twisting injury left ankle 1 month ago with continued pain. Initial encounter. EXAM: LEFT ANKLE COMPLETE - 3+ VIEW COMPARISON:  None. FINDINGS: There is no evidence of fracture, dislocation, or joint effusion. There is no evidence of arthropathy or other focal bone abnormality. Soft tissues are unremarkable. IMPRESSION: Negative exam. Electronically Signed   By: Inge Rise M.D.   On: 10/24/2015 12:06   Dg Foot Complete Left  10/24/2015  CLINICAL DATA:  Twisting foot and ankle injury 1 month ago. Persistent pain. EXAM: LEFT FOOT - COMPLETE 3+ VIEW COMPARISON:  10/24/2015 FINDINGS: There is no evidence of fracture or dislocation. There is no evidence of arthropathy or other focal bone abnormality. Soft tissues are unremarkable. IMPRESSION: No acute osseous finding. Electronically Signed   By: Jerilynn Mages.  Shick M.D.   On: 10/24/2015 12:06   I have personally reviewed and evaluated these images and lab  results as part of my medical decision-making.   EKG Interpretation None      MDM  Patient seen and evaluated in stable condition.  Neurovascularly intact.  Injury 1 month ago.  No acute fracture or finding on xray.  Patient provided ASO for ankle and foot support and instructed to try an arch support insole in her shoe.  She was discharged home in stable condition to follow up outpatient. Final diagnoses:  Ankle sprain, left, initial encounter    1. Ankle/foot sprain    Harvel Quale, MD 10/24/15 2228

## 2015-10-24 NOTE — ED Notes (Signed)
MD at bedside. 

## 2015-10-24 NOTE — Discharge Instructions (Signed)
Your pain is likely secondary to strain/sprain of ligaments in your foot.  Use the ankle support provided and try getting an insert in sole for your shoe.  Take Motrin as needed.  Follow up outpatient with the physician whose information is provided.  Ankle/Foot Sprain An ankle sprain is an injury to the strong, fibrous tissues (ligaments) that hold the bones of your ankle joint together.  CAUSES An ankle sprain is usually caused by a fall or by twisting your ankle. Ankle sprains most commonly occur when you step on the outer edge of your foot, and your ankle turns inward. People who participate in sports are more prone to these types of injuries.  SYMPTOMS   Pain in your ankle. The pain may be present at rest or only when you are trying to stand or walk.  Swelling.  Bruising. Bruising may develop immediately or within 1 to 2 days after your injury.  Difficulty standing or walking, particularly when turning corners or changing directions. DIAGNOSIS  Your caregiver will ask you details about your injury and perform a physical exam of your ankle to determine if you have an ankle sprain. During the physical exam, your caregiver will press on and apply pressure to specific areas of your foot and ankle. Your caregiver will try to move your ankle in certain ways. An X-ray exam may be done to be sure a bone was not broken or a ligament did not separate from one of the bones in your ankle (avulsion fracture).  TREATMENT  Certain types of braces can help stabilize your ankle. Your caregiver can make a recommendation for this. Your caregiver may recommend the use of medicine for pain. If your sprain is severe, your caregiver may refer you to a surgeon who helps to restore function to parts of your skeletal system (orthopedist) or a physical therapist. [place] ice to your injury for 1-2 days or as directed by your caregiver. Applying ice helps to reduce inflammation and pain.  Put  ice in a plastic bag.  Place a towel between your skin and the bag.  Leave the ice on for 15-20 minutes at a time, every 2 hours while you are awake.  Only take over-the-counter or prescription medicines for pain, discomfort, or fever as directed by your caregiver.  Elevate your injured ankle above the level of your heart as much as possible for 2-3 days.  If your caregiver recommends crutches, use them as instructed. Gradually put weight on the affected ankle. Continue to use crutches or a cane until you can walk without feeling pain in your ankle.  If you have a plaster splint, wear the splint as directed by your caregiver. Do not rest it on anything harder than a pillow for the first 24 hours. Do not put weight on it. Do not get it wet. You may take it off to take a shower or bath.  You may have been given an elastic bandage to wear around your ankle to provide support. If the elastic bandage is too tight (you have numbness or tingling in your foot or your foot becomes cold and blue), adjust the bandage to make it comfortable.  If you have an air splint, you may blow more air into it or let air out to make it more comfortable. You may take your splint off at night and before taking a shower or bath. Wiggle your toes in the splint several times per day to decrease swelling. SEEK  MEDICAL CARE IF:   You have rapidly increasing bruising or swelling.  Your toes feel extremely cold or you lose feeling in your foot.  Your pain is not relieved with medicine. SEEK IMMEDIATE MEDICAL CARE IF:  Your toes are numb or blue.  You have severe pain that is increasing. MAKE SURE YOU:   Understand these instructions.  Will watch your condition.  Will get help right away if you are not doing well or get worse.   This information is not intended to replace advice given to you by your health care provider. Make sure you discuss any questions you have with your health care provider.   Document  Released: 11/27/2005 Document Revised: 12/18/2014 Document Reviewed: 12/09/2011 Elsevier Interactive Patient Education [employer].

## 2015-10-28 ENCOUNTER — Ambulatory Visit (INDEPENDENT_AMBULATORY_CARE_PROVIDER_SITE_OTHER): Payer: BLUE CROSS/BLUE SHIELD | Admitting: Family Medicine

## 2015-10-28 ENCOUNTER — Encounter: Payer: Self-pay | Admitting: Family Medicine

## 2015-10-28 VITALS — BP 157/84 | HR 80 | Ht 61.0 in | Wt 135.0 lb

## 2015-10-28 DIAGNOSIS — S99912A Unspecified injury of left ankle, initial encounter: Secondary | ICD-10-CM

## 2015-10-28 NOTE — Patient Instructions (Signed)
You have an ankle sprain. Ice the area for 15 minutes at a time, 3-4 times a day Aleve 2 tabs twice a day with food OR ibuprofen 3 tabs three times a day with food for pain and inflammation. Elevate above the level of your heart when possible Crutches if needed to help with walking Bear weight when tolerated Use boot when up and walking around - hopefully you can transition to the laceup brace again after a couple weeks. Start physical therapy for strengthening and balance exercises. If not improving as expected, we may go ahead with an MRI.  You also have plantar fasciitis. When not wearing the boot make sure you're wearing supportive shoes (consider dr. Zoe Lan active series or our green insoles). Arch binders when up and walking around. Physical therapy for this as well. Follow up with me in 4 weeks.

## 2015-11-01 DIAGNOSIS — S99912A Unspecified injury of left ankle, initial encounter: Secondary | ICD-10-CM | POA: Insufficient documentation

## 2015-11-01 NOTE — Progress Notes (Signed)
PCP: Corine Shelter, PA-C  Subjective:   HPI: Patient is a 34 y.o. female here for left ankle injury.  Patient reports about 1 month ago she accidentally turned her left ankle and heard/felt a pop laterally. Couldn't bear weight initially. Continues to have 5/10 level of pain anterolateral ankle. Also with pain bottom of heel now, sharp. Throbbing even with sitting. Tried ibuprofen. Used ASO but pressure hurt her ankle so stopped using. No skin changes, fever, other complaints.  Past Medical History  Diagnosis Date  . Cancer ([place]) 03/11/2014    cervical cancer  . Anxiety   . Psoriasis     patches on elbows, knees, legs ,abdomen, buttocks.   . Radiation 06/01/14-07/09/14    pelvis 50.4 gray    Current Outpatient Prescriptions on File Prior to Visit  Medication Sig Dispense Refill  . betamethasone dipropionate (DIPROLENE) 0.05 % cream APPLY TO AFFECTED AREA EVERY DAY  0  . estradiol (CLIMARA - DOSED IN MG/24 HR) 0.075 mg/24hr patch   0  . ibuprofen (ADVIL,MOTRIN) 600 MG tablet Take 1 tablet (600 mg total) by mouth every 6 (six) hours as needed. 20 tablet 0  . Multiple Vitamins-Minerals (MULTIVITAMIN GUMMIES ADULTS PO) Take 1 each by mouth daily.    Mar[place] Kitchen venlafaxine XR (EFFEXOR-XR) 75 MG 24 hr capsule Take 75 mg by mouth daily with breakfast.     No current facility-administered medications on file prior to visit.    Past Surgical History  Procedure Laterality Date  . Childbirth      x2 NVD- epidural anesthesia  . Radical hysterectomy N/A 03/31/2014    Procedure: EXPLORATORY LAPAROTOMY/RADICAL HYSTERECTOMY/LYMPHNODE DISECTION;  Surgeon: Alvino Chapel, MD;  Location: [facility];  Service: Gynecology;  Laterality: N/A;  . Abdominal hysterectomy      No Known Allergies  Social History   Social History  . Marital Status: Married    Spouse Name: N/A  . Number of Children: 2  . Years of Education: N/A   Occupational History  . 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 Smoking status: Current Every Day Smoker -- 0.50 packs/day for 15 years    Types: Cigarettes  . Smokeless tobacco: Not on file     Comment: cut back to 5 cigs per day  . Alcohol Use: No  . Drug Use: No  . Sexual Activity: Yes   Other Topics Concern  . Not on file   Social History Narrative    Family History  Problem Relation Age of Onset  . Diabetes Father   . Coronary artery disease Father   . Stroke Maternal Grandfather   . Diabetes Paternal Grandmother     BP 157/84 mmHg  Pulse 80  Ht 5\' 1"  (1.549 m)  Wt 135 lb (61.236 kg)  BMI 25.52 kg/m2  LMP 03/31/2014  Review of Systems: See HPI above.    Objective:  Physical Exam:  Gen: NAD, comfortable in exam room.  Left ankle/foot: Mild swelling.  No bruising, other deformity. FROM. TTP over ATFL, ant ankle joint, medial calcaneus at plantar fascia insertion.  No other tenderness. 1+ ant drawer and talar tilt.   Negative syndesmotic compression. Thompsons test negative. NV intact distally.  Right foot/ankle: FROM without pain.    Assessment & Plan:  1. Left ankle/foot pain - combination of ankle sprain along with plantar fasciitis.  Discussed options - she is going to start with a cam walker and physical therapy. Try to transition back  to ASO as tolerated.  NSAIDs, icing, elevation.  Arch binders provided for plantar fascia.  F/u in 4 weeks.

## 2015-11-01 NOTE — Assessment & Plan Note (Signed)
combination of ankle sprain along with plantar fasciitis.  Discussed options - she is going to start with a cam walker and physical therapy. Try to transition back to ASO as tolerated.  NSAIDs, icing, elevation.  Arch binders provided for plantar fascia.  F/u in 4 weeks.

## 2015-11-19 ENCOUNTER — Other Ambulatory Visit (HOSPITAL_COMMUNITY)
Admission: RE | Admit: 2015-11-19 | Discharge: 2015-11-19 | Disposition: A | Discharge status: Home / Self Care | Payer: BLUE CROSS/BLUE SHIELD | Source: Ambulatory Visit | Attending: Gynecology | Admitting: Gynecology

## 2015-11-19 ENCOUNTER — Ambulatory Visit: Payer: BLUE CROSS/BLUE SHIELD | Attending: Gynecology | Admitting: Gynecology

## 2015-11-19 ENCOUNTER — Encounter: Payer: Self-pay | Admitting: Gynecology

## 2015-11-19 VITALS — BP 118/78 | HR 75 | Temp 98.2°F | Resp 18 | Ht 61.0 in | Wt 148.2 lb

## 2015-11-19 DIAGNOSIS — N959 Unspecified menopausal and perimenopausal disorder: Secondary | ICD-10-CM

## 2015-11-19 DIAGNOSIS — C539 Malignant neoplasm of cervix uteri, unspecified: Secondary | ICD-10-CM | POA: Diagnosis present

## 2015-11-19 DIAGNOSIS — Z01411 Encounter for gynecological examination (general) (routine) with abnormal findings: Secondary | ICD-10-CM | POA: Insufficient documentation

## 2015-11-19 DIAGNOSIS — N3946 Mixed incontinence: Secondary | ICD-10-CM | POA: Insufficient documentation

## 2015-11-19 DIAGNOSIS — R3911 Hesitancy of micturition: Secondary | ICD-10-CM

## 2015-11-19 DIAGNOSIS — Z8541 Personal history of malignant neoplasm of cervix uteri: Secondary | ICD-10-CM

## 2015-11-19 MED ORDER — ESTRADIOL 1 MG PO TABS: 1.0000 mg | ORAL_TABLET | Freq: Every day | ORAL | Status: DC

## 2015-11-19 MED ORDER — OXYBUTYNIN CHLORIDE ER 5 MG PO TB24: 5.0000 mg | ORAL_TABLET | Freq: Every day | ORAL | Status: DC

## 2015-11-19 NOTE — Addendum Note (Signed)
Addended by: Christa See on: 11/19/2015 03:59 PM   Modules accepted: Orders

## 2015-11-19 NOTE — Patient Instructions (Signed)
Plan to follow up with Dr. Fermin Schwab six months after seeing Dr. Sondra Come.  Please call for any questions or concerns.  Begin taking estrace 1 mg once daily and ditropan 5 mg XL once daily and you may increase the dose to 10 mg daily if 5 mg does not work. Call in the Summer of 2017 to schedule your appt with Dr. Fermin Schwab in August 2017.   Estradiol tablets What is this medicine? ESTRADIOL (es tra DYE ole) is an estrogen. It is mostly used as hormone replacement in menopausal women. It helps to treat hot flashes and prevent osteoporosis. It is also used to treat women with low estrogen levels or those who have had their ovaries removed. This medicine may be used for other purposes; ask your health care provider or pharmacist if you have questions. What should I tell my health care provider before I take this medicine? They need to know if you have or ever had any of these conditions: -abnormal vaginal bleeding -blood vessel disease or blood clots -breast, cervical, endometrial, ovarian, liver, or uterine cancer -dementia -diabetes -gallbladder disease -heart disease or recent heart attack -high blood pressure -high cholesterol -high level of calcium in the blood -hysterectomy -kidney disease -liver disease -migraine headaches -protein C deficiency -protein S deficiency -stroke -systemic lupus erythematosus (SLE) -tobacco smoker -an unusual or allergic reaction to estrogens, other hormones, medicines, foods, dyes, or preservatives -pregnant or trying to get pregnant -breast-feeding How should I use this medicine? Take this medicine by mouth. To reduce nausea, this medicine may be taken with food. Follow the directions on the prescription label. Take this medicine at the same time each day and in the order directed on the package. Do not take your medicine more often than directed. Contact your pediatrician regarding the use of this medicine in children. Special care may be  needed. A patient package insert for the product will be given with each prescription and refill. Read this sheet carefully each time. The sheet may change frequently. Overdosage: If you think you have taken too much of this medicine contact a poison control center or emergency room at once. NOTE: This medicine is only for you. Do not share this medicine with others. What if I miss a dose? If you miss a dose, take it as soon as you can. If it is almost time for your next dose, take only that dose. Do not take double or extra doses. What may interact with this medicine? Do not take this medicine with any of the following medications: -aromatase inhibitors like aminoglutethimide, anastrozole, exemestane, letrozole, testolactone This medicine may also interact with the following medications: -carbamazepine -certain antibiotics used to treat infections -certain barbiturates or benzodiazepines used for inducing sleep or treating seizures -grapefruit juice -medicines for fungus infections like itraconazole and ketoconazole -raloxifene or tamoxifen -rifabutin, rifampin, or rifapentine -ritonavir -St. John's Wort -warfarin This list may not describe all possible interactions. Give your health care provider a list of all the medicines, herbs, non-prescription drugs, or dietary supplements you use. Also tell them if you smoke, drink alcohol, or use illegal drugs. Some items may interact with your medicine. What should I watch for while using this medicine? Visit your doctor or health care professional for regular checks on your progress. You will need a regular breast and pelvic exam and Pap smear while on this medicine. You should also discuss the need for regular mammograms with your health care professional, and follow his or her guidelines for these tests.  This medicine can make your body retain fluid, making your fingers, hands, or ankles swell. Your blood pressure can go up. Contact your doctor or  health care professional if you feel you are retaining fluid. If you have any reason to think you are pregnant, stop taking this medicine right away and contact your doctor or health care professional. Smoking increases the risk of getting a blood clot or having a stroke while you are taking this medicine, especially if you are more than 36 years old. You are strongly advised not to smoke. If you wear contact lenses and notice visual changes, or if the lenses begin to feel uncomfortable, consult your eye doctor or health care professional. This medicine can increase the risk of developing a condition (endometrial hyperplasia) that may lead to cancer of the lining of the uterus. Taking progestins, another hormone drug, with this medicine lowers the risk of developing this condition. Therefore, if your uterus has not been removed (by a hysterectomy), your doctor may prescribe a progestin for you to take together with your estrogen. You should know, however, that taking estrogens with progestins may have additional health risks. You should discuss the use of estrogens and progestins with your health care professional to determine the benefits and risks for you. If you are going to have surgery, you may need to stop taking this medicine. Consult your health care professional for advice before you schedule the surgery. What side effects may I notice from receiving this medicine? Side effects that you should report to your doctor or health care professional as soon as possible: -allergic reactions like skin rash, itching or hives, swelling of the face, lips, or tongue -breast tissue changes or discharge -changes in vision -chest pain -confusion, trouble speaking or understanding -dark urine -general ill feeling or flu-like symptoms -light-colored stools -nausea, vomiting -pain, swelling, warmth in the leg -right upper belly pain -severe headaches -shortness of breath -sudden numbness or weakness of  the face, arm or leg -trouble walking, dizziness, loss of balance or coordination -unusual vaginal bleeding -yellowing of the eyes or skin Side effects that usually do not require medical attention (report to your doctor or health care professional if they continue or are bothersome): -hair loss -increased hunger or thirst -increased urination -symptoms of vaginal infection like itching, irritation or unusual discharge -unusually weak or tired This list may not describe all possible side effects. Call your doctor for medical advice about side effects. You may report side effects to FDA at 1-800-FDA-1088. Where should I keep my medicine? Keep out of the reach of children. Store at room temperature between 20 and 25 degrees C (68 and 77 degrees F). Keep container tightly closed. Protect from light. Throw away any unused medicine after the expiration date. NOTE: This sheet is a summary. It may not cover all possible information. If you have questions about this medicine, talk to your doctor, pharmacist, or health care provider.    2016, Elsevier/Gold Standard. (2011-03-01 09:19:24)  Oxybutynin extended-release tablets What is this medicine? OXYBUTYNIN (ox i BYOO ti nin) is used to treat overactive bladder. This medicine reduces the amount of bathroom visits. It may also help to control wetting accidents. This medicine may be used for other purposes; ask your health care provider or pharmacist if you have questions. What should I tell my health care provider before I take this medicine? They need to know if you have any of these conditions: -autonomic neuropathy -dementia -difficulty passing urine -glaucoma -intestinal obstruction -kidney  disease -liver disease -myasthenia gravis -Parkinson's disease -an unusual or allergic reaction to oxybutynin, other medicines, foods, dyes, or preservatives -pregnant or trying to get pregnant -breast-feeding How should I use this medicine? Take  this medicine by mouth with a glass of water. Swallow whole, do not crush, cut, or chew. Follow the directions on the prescription label. You can take this medicine with or without food. Take your doses at regular intervals. Do not take your medicine more often than directed. Talk to your pediatrician regarding the use of this medicine in children. Special care may be needed. While this drug may be prescribed for children as young as 6 years for selected conditions, precautions do apply. Overdosage: If you think you have taken too much of this medicine contact a poison control center or emergency room at once. NOTE: This medicine is only for you. Do not share this medicine with others. What if I miss a dose? If you miss a dose, take it as soon as you can. If it is almost time for your next dose, take only that dose. Do not take double or extra doses. What may interact with this medicine? -antihistamines for allergy, cough and cold -atropine -certain medicines for bladder problems like oxybutynin, tolterodine -certain medicines for Parkinson's disease like benztropine, trihexyphenidyl -certain medicines for stomach problems like dicyclomine, hyoscyamine -certain medicines for travel sickness like scopolamine -clarithromycin -erythromycin -ipratropium -medicines for fungal infections, like fluconazole, itraconazole, ketoconazole or voriconazole This list may not describe all possible interactions. Give your health care provider a list of all the medicines, herbs, non-prescription drugs, or dietary supplements you use. Also tell them if you smoke, drink alcohol, or use illegal drugs. Some items may interact with your medicine. What should I watch for while using this medicine? It may take a few weeks to notice the full benefit from this medicine. You may need to limit your intake tea, coffee, caffeinated sodas, and alcohol. These drinks may make your symptoms worse. You may get drowsy or dizzy. Do  not drive, use machinery, or do anything that needs mental alertness until you know how this medicine affects you. Do not stand or sit up quickly, especially if you are an older patient. This reduces the risk of dizzy or fainting spells. Alcohol may interfere with the effect of this medicine. Avoid alcoholic drinks. Your mouth may get dry. Chewing sugarless gum or sucking hard candy, and drinking plenty of water may help. Contact your doctor if the problem does not go away or is severe. This medicine may cause dry eyes and blurred vision. If you wear contact lenses, you may feel some discomfort. Lubricating drops may help. See your eyecare professional if the problem does not go away or is severe. You may notice the shells of the tablets in your stool from time to time. This is normal. Avoid extreme heat. This medicine can cause you to sweat less than normal. Your body temperature could increase to dangerous levels, which may lead to heat stroke. What side effects may I notice from receiving this medicine? Side effects that you should report to your doctor or health care professional as soon as possible: -allergic reactions like skin rash, itching or hives, swelling of the face, lips, or tongue -agitation -breathing problems -confusion -fever -flushing (reddening of the skin) -hallucinations -memory loss -pain or difficulty passing urine -palpitations -unusually weak or tired Side effects that usually do not require medical attention (report to your doctor or health care professional if they continue  or are bothersome): -constipation -headache -sexual difficulties (impotence) This list may not describe all possible side effects. Call your doctor for medical advice about side effects. You may report side effects to FDA at 1-800-FDA-1088. Where should I keep my medicine? Keep out of the reach of children. Store at room temperature between 15 and 30 degrees C (59 and 86 degrees F). Protect from  moisture and humidity. Throw away any unused medicine after the expiration date. NOTE: This sheet is a summary. It may not cover all possible information. If you have questions about this medicine, talk to your doctor, pharmacist, or health care provider.    2016, Elsevier/Gold Standard. (2014-02-12 10:57:06)

## 2015-11-19 NOTE — Progress Notes (Signed)
Consult Note: Gyn-Onc   Stacy Buck 34 y.o. female  Chief Complaint  Patient presents with  . 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 Clinically NED. Menopausal symptoms. Symptoms of an unstable bladder.  Plan:    Pap smears are obtained today.  She is given a prescription for Estrace 1 mg daily. Another prescription for Ditropan XL 5 mg daily increasing to 10 mg daily as needed. Return to see me in one year.  Interval history: Patient returns today as previously scheduled. Patient currently is doing well. She denies any GI symptoms except for some urinary hesitancy. She denies any pelvic pain pressure or bleeding.. She denies any vaginal discharge or bleeding her appetite is good and her functional status is excellent.   She has had difficulty keeping her estrogen patches on and is asking for an oral form of estrogen. Further she has considerable difficulty with urinary incontinence which sounds like an unstable bladder.  HPI:  34 year old gravida 2 para 2 seen in consultation request of Dr. Christophe Louis regarding management of a newly diagnosed cervical carcinoma. Patient believes she had her last Pap smear in 2011. Recently she had a Pap smear showing atypical squamous cells and high-grade dysplasia cannot be ruled out. 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 In retrospect the patient reports she's had postcoital bleeding for several months. She denies any pelvic pain or any GI or GU symptoms. Functional status is excellent.  She had a CT scan obtained on Essie 7 that shows no evidence of metastatic disease  The patient underwent a radical hysterectomy and pelvic lymphadenectomy 03/31/2014. Final pathology showed a 4.5 cm lesion, deep cervical stromal invasion, and angiolymphatic involvement. She received postoperative whole pelvis radiation therapy. Total doses 50.4 gray and 28 fractions completed  on 07/09/2014. She tolerated the radiation therapy well with minimal side effects.    Review of Systems:10 point review of systems is negative except as noted in interval history.   Vitals: Blood pressure 118/78, pulse 75, temperature 98.2 F (36.8 C), temperature source Oral, resp. rate 18, height 5\' 1"  (1.549 m), weight 148 lb 3.2 oz (67.223 kg), last menstrual period 03/31/2014, SpO2 100 %.  Physical Exam: General : The patient is a healthy woman in no acute distress.  HEENT: normocephalic, extraoccular movements normal; neck is supple without thyromegally  Lynphnodes: Supraclavicular and inguinal nodes not enlarged  Abdomen: Soft, non-tender, no ascites, no organomegally, no masses, no hernias Pfannenstiel incision is well-healed.  Pelvic:  EGBUS: Normal female  Vagina: Normal, no lesions cuff is well-healed. Urethra and Bladder: Normal, non-tender  Cervix: Surgically absent Uterus surgically absent  Bi-manual examination: Non-tender; no adenxal masses or nodularity  Rectal: normal sphincter tone, no masses, no blood , there is no parametrial involvement Lower extremities: No edema or varicosities. Normal range of motion      No Known Allergies  Past Medical History  Diagnosis Date  . Cancer ([place]) 03/11/2014    cervical cancer  . Anxiety   . Psoriasis     patches on elbows, knees, legs ,abdomen, buttocks.   . Radiation 06/01/14-07/09/14    pelvis 50.4 gray    Past Surgical History  Procedure Laterality Date  . Childbirth      x2 NVD- epidural anesthesia  . Radical hysterectomy N/A 03/31/2014    Procedure: EXPLORATORY LAPAROTOMY/RADICAL HYSTERECTOMY/LYMPHNODE DISECTION;  Surgeon: Alvino Chapel, MD;  Location: [facility];  Service: Gynecology;  Laterality: N/A;  . Abdominal hysterectomy      Current Outpatient Prescriptions  Medication Sig Dispense Refill  . betamethasone dipropionate (DIPROLENE) 0.05 % cream APPLY TO AFFECTED AREA EVERY DAY  0  . estradiol  (CLIMARA - DOSED IN MG/24 HR) 0.075 mg/24hr patch Place 0.075 mg onto the skin once a week.   0  . ibuprofen (ADVIL,MOTRIN) 600 MG tablet Take 1 tablet (600 mg total) by mouth every 6 (six) hours as needed. 20 tablet 0  . Multiple Vitamins-Minerals (MULTIVITAMIN GUMMIES ADULTS PO) Take 1 each by mouth daily.    Marland Kitchen venlafaxine XR (EFFEXOR-XR) 75 MG 24 hr capsule Take 75 mg by mouth daily with breakfast.     No current facility-administered medications for this visit.    Social History   Social History  . Marital Status: Married    Spouse Name: N/A  . Number of Children: 2  . Years of Education: N/A   Occupational History  . 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 Smoking status: Current Every Day Smoker -- 0.50 packs/day for 15 years    Types: Cigarettes  . Smokeless tobacco: Not on file     Comment: cut back to 5 cigs per day  . Alcohol Use: No  . Drug Use: No  . Sexual Activity: Yes   Other Topics Concern  . Not on file   Social History Narrative    Family History  Problem Relation Age of Onset  . Diabetes Father   . Coronary artery disease Father   . Stroke Maternal Grandfather   . Diabetes Paternal Grandmother       Alvino Chapel, MD 11/19/2015, 10:49 AM

## 2015-11-24 ENCOUNTER — Telehealth: Payer: Self-pay

## 2015-11-24 LAB — CYTOLOGY - PAP

## 2015-11-24 NOTE — Telephone Encounter (Signed)
Orders received from [facility] to contact the patient to update with PAP results obtained on 11/19/2015 are "normal" . Patient contacted and updated , patient states understanding , denie4s further questions at this time.

## 2015-11-25 ENCOUNTER — Ambulatory Visit: Payer: BLUE CROSS/BLUE SHIELD | Admitting: Family Medicine

## 2015-12-16 ENCOUNTER — Ambulatory Visit: Payer: BLUE CROSS/BLUE SHIELD | Admitting: Radiation Oncology

## 2016-01-27 ENCOUNTER — Ambulatory Visit
Admission: RE | Admit: 2016-01-27 | Discharge: 2016-01-27 | Disposition: A | Discharge status: Home / Self Care | Payer: BLUE CROSS/BLUE SHIELD | Source: Ambulatory Visit | Attending: Radiation Oncology | Admitting: Radiation Oncology

## 2016-01-27 VITALS — BP 130/73 | HR 64 | Temp 98.5°F | Resp 16 | Ht 61.0 in | Wt 147.0 lb

## 2016-01-27 DIAGNOSIS — C538 Malignant neoplasm of overlapping sites of cervix uteri: Secondary | ICD-10-CM

## 2016-01-27 NOTE — Progress Notes (Signed)
Radiation Oncology         [phone]) [phone] ________________________________  Name: Orla Wallington MRN: [id number]  Date: 01/27/2016  DOB: 12-12-1980    Follow-Up Visit Note  CC: HEPLER,MARK, PA-C  Clarke-Pearson, Daniel,*   Diagnosis: Stage IB-1 squamous cell carcinoma cervix with deep cervical stromal invasion and angiolymphatic involvement  Interval Since Last Radiation:  1  Year and 6 months   Narrative:  Stacy Buck here for follow up. She denies having pain. She reports her urinary urgency/incontinence has improved since starting taking Ditropan two months ago.She denies having any bowel issues and vaginal/rectal bleeding or discharge. She reports having fatigue. She is using a vaginal dilator.  Saw Dr.Clarke-Pearson in December, Pap smears obtained at that time. No stinging, no blood in urine, no nausea and no abdominal pain. Takes oral hormone replacement. Notes a recent but resolving sinus infection.                 ALLERGIES:  has No Known Allergies.  Meds: Current Outpatient Prescriptions  Medication Sig Dispense Refill  . azithromycin (ZITHROMAX) 250 MG tablet TAKE 2 TABLETS BY MOUTH ON DAY 1 THEN 1 TABLET DAILY ON DAYS 2 THRU 5.  0  . betamethasone dipropionate (DIPROLENE) 0.05 % cream APPLY TO AFFECTED AREA EVERY DAY  0  . estradiol (ESTRACE) 1 MG tablet Take 1 tablet (1 mg total) by mouth daily. 90 tablet 6  . ibuprofen (ADVIL,MOTRIN) 600 MG tablet Take 1 tablet (600 mg total) by mouth every 6 (six) hours as needed. 20 tablet 0  . Multiple Vitamins-Minerals (MULTIVITAMIN GUMMIES ADULTS PO) Take 1 each by mouth daily.    Marland Kitchen oxybutynin (DITROPAN-XL) 5 MG 24 hr tablet Take 1 tablet (5 mg total) by mouth at bedtime. 30 tablet 6  . venlafaxine XR (EFFEXOR-XR) 75 MG 24 hr capsule Take 75 mg by mouth daily with breakfast.     No current facility-administered medications for this encounter.    Physical Findings: The patient is in no acute distress. Patient is alert and  oriented.  height is 5\' 1"  (1.549 m) and weight is 147 lb (66.679 kg). Her oral temperature is 98.5 F (36.9 C). Her blood pressure is 130/73 and her pulse is 64. Her respiration is 16. Marland Kitchen  No palpable subclavicular or axillary adenopathy. Lungs are clear to auscultation. The heart has a regular rhythm and rate. The abdomen is soft and nontender with normal bowel sounds. No inguinal adenopathy appreciated.   On pelvic examination the external genitalia are unremarkable. Speculum exam is performed. There is mild radiation changes noted in the proximal vagina. No mucosal lesions. On bimanual and rectovaginal examination there no pelvic masses appreciated.   Lab Findings: Lab Results  Component Value Date   WBC 11.7* 04/02/2014   HGB 8.8* 04/02/2014   HCT 26.1* 04/02/2014   MCV 90.0 04/02/2014   PLT 202 04/02/2014    Radiographic Findings: No results found.  Impression:  No evidence of recurrence on clinical exam today. Pap smear obtained by [facility] in December.   Plan:  Routine follow-up in June. She will follow up with gynecologic oncology next winter.  -----------------------------------  Blair Promise, PhD, MD   This document serves as a record of services personally performed by Gery Pray, MD. It was created on his behalf by Derek Mound, a trained medical scribe. The creation of this record is based on the scribe's personal observations and the provider's statements to them. This document has been checked and approved by  the attending provider.

## 2016-01-27 NOTE — Progress Notes (Signed)
Stacy Buck here for follow up.  She denies having pain.  She reports her urinary urgency/incontinence has improved since starting taking Ditropan two months ago.  She denies having any bowel issues and vaginal/rectal bleeding or discharge.  She reports having fatigue.  She is using a vaginal dilator.  BP 130/73 mmHg  Pulse 64  Temp(Src) 98.5 F (36.9 C) (Oral)  Resp 16  Ht 5\' 1"  (1.549 m)  Wt 147 lb (66.679 kg)  BMI 27.79 kg/m2  LMP 03/31/2014   Wt Readings from Last 3 Encounters:  01/27/16 147 lb (66.679 kg)  11/19/15 148 lb 3.2 oz (67.223 kg)  10/28/15 135 lb (61.236 kg)

## 2016-06-01 ENCOUNTER — Ambulatory Visit
Admission: RE | Admit: 2016-06-01 | Discharge: 2016-06-01 | Disposition: A | Discharge status: Home / Self Care | Payer: BLUE CROSS/BLUE SHIELD | Source: Ambulatory Visit | Attending: Radiation Oncology | Admitting: Radiation Oncology

## 2016-06-01 ENCOUNTER — Encounter: Payer: Self-pay | Admitting: Radiation Oncology

## 2016-06-01 VITALS — BP 117/79 | HR 70 | Temp 98.2°F | Ht 61.0 in | Wt 145.2 lb

## 2016-06-01 DIAGNOSIS — C538 Malignant neoplasm of overlapping sites of cervix uteri: Secondary | ICD-10-CM

## 2016-06-01 DIAGNOSIS — C801 Malignant (primary) neoplasm, unspecified: Secondary | ICD-10-CM | POA: Insufficient documentation

## 2016-06-01 DIAGNOSIS — Z79899 Other long term (current) drug therapy: Secondary | ICD-10-CM | POA: Insufficient documentation

## 2016-06-01 DIAGNOSIS — Z923 Personal history of irradiation: Secondary | ICD-10-CM | POA: Diagnosis not present

## 2016-06-01 NOTE — Progress Notes (Signed)
Secret Dudziak here for follow up.  She denies having pain, bladder/bowel issues or vaginal/rectal bleeding.  She reports her energy level is "OK."  She reports having a good appetite.  BP 117/79 mmHg  Pulse 70  Temp(Src) 98.2 F (36.8 C) (Oral)  Ht 5\' 1"  (1.549 m)  Wt 145 lb 3.2 oz (65.862 kg)  BMI 27.45 kg/m2  SpO2 100%  LMP 03/31/2014   Wt Readings from Last 3 Encounters:  06/01/16 145 lb 3.2 oz (65.862 kg)  01/27/16 147 lb (66.679 kg)  11/19/15 148 lb 3.2 oz (67.223 kg)

## 2016-06-01 NOTE — Progress Notes (Signed)
  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 She denies having pain, bladder/bowel issues, or vaginal/rectal bleeding. She reports her energy level is "OK." She reports having a good appetite. Still uses her vaginal dilator and denies bleeding after its use. The patient's last Pap smear was 11/2015, negative for malignancy.  ALLERGIES:  has No Known Allergies.  Meds: Current Outpatient Prescriptions  Medication Sig Dispense Refill  . betamethasone dipropionate (DIPROLENE) 0.05 % cream APPLY TO AFFECTED AREA EVERY DAY  0  . estradiol (ESTRACE) 1 MG tablet Take 1 tablet (1 mg total) by mouth daily. 90 tablet 6  . ibuprofen (ADVIL,MOTRIN) 600 MG tablet Take 1 tablet (600 mg total) by mouth every 6 (six) hours as needed. 20 tablet 0  . Multiple Vitamins-Minerals (MULTIVITAMIN GUMMIES ADULTS PO) Take 1 each by mouth daily.    Marland Kitchen oxybutynin (DITROPAN-XL) 5 MG 24 hr tablet Take 1 tablet (5 mg total) by mouth at bedtime. 30 tablet 6  . venlafaxine XR (EFFEXOR-XR) 75 MG 24 hr capsule Take 75 mg by mouth daily with breakfast.     No current facility-administered medications for this encounter.    Physical Findings: The patient is in no acute distress. Patient is alert and oriented.  height is 5\' 1"  (1.549 m) and weight is 145 lb 3.2 oz (65.862 kg). Her oral temperature is 98.2 F (36.8 C). Her blood pressure is 117/79 and her pulse is 70. Her oxygen saturation is 100%.  No palpable subclavicular or axillary adenopathy. Lungs are clear  to auscultation. The heart has a regular rhythm and rate. The abdomen is soft and nontender with normal bowel sounds. No inguinal adenopathy appreciated.  On pelvic examination the external genitalia were unremarkable. A speculum exam was performed. There are no mucosal lesions noted in the vaginal vault. On bimanual and rectovaginal examination there were no pelvic masses appreciated. Sphincter tone was normal.  Lab Findings: Lab Results  Component Value Date   WBC 11.7* 04/02/2014   HGB 8.8* 04/02/2014   HCT 26.1* 04/02/2014   MCV 90.0 04/02/2014   PLT 202 04/02/2014    Radiographic Findings: No results found.  Impression:  No evidence of recurrence on clinical exam today.   Plan:  Routine follow-up in 1 year. The patient will see Dr. Fermin Schwab in December With pelvic exam and Pap smear.  -----------------------------------  Blair Promise, PhD, MD  This document serves as a record of services personally performed by Gery Pray, MD. It was created on his behalf by Derek Mound, a trained medical scribe. The creation of this record is based on the scribe's personal observations and the provider's statements to them. This document has been checked and approved by the attending provider.

## 2016-06-01 NOTE — Addendum Note (Signed)
Encounter addended by: Karen R Hess, RN on: 06/01/2016  1:04 PM<BR>     Documentation filed: Charges VN

## 2016-12-08 ENCOUNTER — Ambulatory Visit: Payer: BLUE CROSS/BLUE SHIELD | Attending: Gynecology | Admitting: Gynecology

## 2016-12-08 ENCOUNTER — Other Ambulatory Visit (HOSPITAL_COMMUNITY)
Admission: RE | Admit: 2016-12-08 | Discharge: 2016-12-08 | Disposition: A | Discharge status: Home / Self Care | Payer: BLUE CROSS/BLUE SHIELD | Source: Ambulatory Visit | Attending: Gynecology | Admitting: Gynecology

## 2016-12-08 ENCOUNTER — Encounter: Payer: Self-pay | Admitting: Gynecology

## 2016-12-08 VITALS — BP 112/71 | HR 77 | Temp 97.9°F | Resp 18 | Ht 61.0 in | Wt 149.0 lb

## 2016-12-08 DIAGNOSIS — Z8541 Personal history of malignant neoplasm of cervix uteri: Secondary | ICD-10-CM | POA: Diagnosis not present

## 2016-12-08 DIAGNOSIS — Z01411 Encounter for gynecological examination (general) (routine) with abnormal findings: Secondary | ICD-10-CM | POA: Diagnosis present

## 2016-12-08 DIAGNOSIS — C539 Malignant neoplasm of cervix uteri, unspecified: Secondary | ICD-10-CM | POA: Diagnosis not present

## 2016-12-08 DIAGNOSIS — Z833 Family history of diabetes mellitus: Secondary | ICD-10-CM | POA: Diagnosis not present

## 2016-12-08 DIAGNOSIS — Z9889 Other specified postprocedural states: Secondary | ICD-10-CM | POA: Diagnosis not present

## 2016-12-08 DIAGNOSIS — Z79899 Other long term (current) drug therapy: Secondary | ICD-10-CM | POA: Insufficient documentation

## 2016-12-08 DIAGNOSIS — Z90710 Acquired absence of both cervix and uterus: Secondary | ICD-10-CM | POA: Diagnosis not present

## 2016-12-08 DIAGNOSIS — R3911 Hesitancy of micturition: Secondary | ICD-10-CM | POA: Insufficient documentation

## 2016-12-08 DIAGNOSIS — F419 Anxiety disorder, unspecified: Secondary | ICD-10-CM | POA: Diagnosis not present

## 2016-12-08 DIAGNOSIS — Z8249 Family history of ischemic heart disease and other diseases of the circulatory system: Secondary | ICD-10-CM | POA: Insufficient documentation

## 2016-12-08 DIAGNOSIS — Z823 Family history of stroke: Secondary | ICD-10-CM | POA: Insufficient documentation

## 2016-12-08 DIAGNOSIS — N3946 Mixed incontinence: Secondary | ICD-10-CM

## 2016-12-08 DIAGNOSIS — Z923 Personal history of irradiation: Secondary | ICD-10-CM | POA: Diagnosis not present

## 2016-12-08 DIAGNOSIS — N930 Postcoital and contact bleeding: Secondary | ICD-10-CM | POA: Insufficient documentation

## 2016-12-08 DIAGNOSIS — Z87891 Personal history of nicotine dependence: Secondary | ICD-10-CM | POA: Diagnosis not present

## 2016-12-08 MED ORDER — ESTRADIOL 1 MG PO TABS: 1.0000 mg | ORAL_TABLET | Freq: Every day | ORAL | 6 refills | Status: DC

## 2016-12-08 MED ORDER — OXYBUTYNIN CHLORIDE ER 5 MG PO TB24: 5.0000 mg | ORAL_TABLET | Freq: Every day | ORAL | 6 refills | Status: AC

## 2016-12-08 NOTE — Progress Notes (Signed)
Consult Note: Gyn-Onc   Stacy Buck 35 y.o. female  Chief Complaint  Patient presents with  . Cervical Cancer    Assessment : Stage IB 1 squamous cell carcinoma cervix. Clinically NED. Menopausal symptoms resolved using Estrace.. Symptoms of an unstable bladder.  Plan:    Pap smears are obtained today.  She is given a renewed prescription for Estrace 1 mg daily. Another prescription for Ditropan XL 5 mg daily increasing to 10 mg daily as needed. Return to see me in one year. She will see Dr. Sondra Come in 6 months  Interval history: Patient returns today as previously scheduled. Patient currently is doing well. She denies any GI symptoms except for some urinary hesitancy and some minimal incontinence. The incontinence is much better when she was Ditropan but she discontinued that.. She denies any pelvic pain pressure or bleeding.. She denies any vaginal discharge or bleeding her appetite is good and her functional status is excellent.  HPI:  35 year old gravida 2 para 2 seen in consultation request of Dr. Christophe Louis regarding management of a newly diagnosed cervical carcinoma. Patient believes she had her last Pap smear in 2011. Recently she had a Pap smear showing atypical squamous cells and high-grade dysplasia cannot be ruled out. 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 In retrospect the patient reports she's had postcoital bleeding for several months. She denies any pelvic pain or any GI or GU symptoms. Functional status is excellent.  She had a CT scan obtained on [place] 7 that shows no evidence of metastatic disease  The patient underwent a radical hysterectomy and pelvic lymphadenectomy 03/31/2014. Final pathology showed a 4.5 cm lesion, deep cervical stromal invasion, and angiolymphatic involvement. She received postoperative whole pelvis radiation therapy. Total doses 50.4 gray and 28 fractions completed on 07/09/2014. She tolerated the  radiation therapy well with minimal side effects.    Review of Systems:10 point review of systems is negative except as noted in interval history.   Vitals: Blood pressure 112/71, pulse 77, temperature 97.9 F (36.6 C), temperature source Oral, resp. rate 18, height 5\' 1"  (1.549 m), weight 149 lb (67.6 kg), last menstrual period 03/31/2014, SpO2 100 %.  Physical Exam: General : The patient is a healthy woman in no acute distress.  HEENT: normocephalic, extraoccular movements normal; neck is supple without thyromegally  Lynphnodes: Supraclavicular and inguinal nodes not enlarged  Abdomen: Soft, non-tender, no ascites, no organomegally, no masses, no hernias Pfannenstiel incision is well-healed.  Pelvic:  EGBUS: Normal female  Vagina: Normal, no lesions cuff is well-healed. Urethra and Bladder: Normal, non-tender  Cervix: Surgically absent Uterus surgically absent  Bi-manual examination: Non-tender; no adenxal masses or nodularity  Rectal: normal sphincter tone, no masses, no blood , there is no parametrial involvement Lower extremities: No edema or varicosities. Normal range of motion      No Known Allergies  Past Medical History:  Diagnosis Date  . Anxiety   . Cancer ([place]) 03/11/2014   cervical cancer  . Psoriasis    patches on elbows, knees, legs ,abdomen, buttocks.   . Radiation 06/01/14-07/09/14   pelvis 50.4 gray    Past Surgical History:  Procedure Laterality Date  . ABDOMINAL HYSTERECTOMY    . childbirth     x2 NVD- epidural anesthesia  . RADICAL HYSTERECTOMY N/A 03/31/2014   Procedure: EXPLORATORY LAPAROTOMY/RADICAL HYSTERECTOMY/LYMPHNODE DISECTION;  Surgeon: Alvino Chapel, MD;  Location: [facility];  Service: Gynecology;  Laterality: N/A;    Current Outpatient Prescriptions  Medication Sig Dispense Refill  . betamethasone dipropionate (DIPROLENE) 0.05 % cream APPLY TO AFFECTED AREA EVERY DAY  0  . estradiol (ESTRACE) 1 MG tablet Take 1 tablet (1 mg  total) by mouth daily. 90 tablet 6  . FLUARIX QUADRIVALENT 0.5 ML injection TO BE ADMINISTERED BY PHARMACIST FOR IMMUNIZATION  0  . ibuprofen (ADVIL,MOTRIN) 600 MG tablet Take 1 tablet (600 mg total) by mouth every 6 (six) hours as needed. 20 tablet 0  . Multiple Vitamins-Minerals (MULTIVITAMIN GUMMIES ADULTS PO) Take 1 each by mouth daily.    Marland Kitchen oxybutynin (DITROPAN-XL) 5 MG 24 hr tablet Take 1 tablet (5 mg total) by mouth at bedtime. 30 tablet 6  . venlafaxine XR (EFFEXOR-XR) 75 MG 24 hr capsule Take 75 mg by mouth daily with breakfast.     No current facility-administered medications for this visit.     Social History   Social History  . Marital status: Married    Spouse name: N/A  . Number of children: 2  . 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 Smoking status: Former Smoker    Packs/day: 0.50    Years: 15.00    Types: Cigarettes    Quit date: 08/11/2016  . Smokeless tobacco: Never Used     Comment: cut back to 5 cigs per day  . Alcohol use No  . Drug use: No  . Sexual activity: Yes   Other Topics Concern  . Not on file   Social History Narrative  . No narrative on file    Family History  Problem Relation Age of Onset  . Diabetes Father   . Coronary artery disease Father   . Stroke Maternal Grandfather   . Diabetes Paternal Grandmother       Marti Sleigh, MD 12/08/2016, 12:04 PM

## 2016-12-08 NOTE — Patient Instructions (Addendum)
We will call you with the results of your pap smear from today.  Plan to follow up with Dr. Fermin Schwab in one year.  Please call our office at [phone] in August or September 2018 to schedule an appt for Dec 2018. Happy New Year!

## 2016-12-14 ENCOUNTER — Telehealth: Payer: Self-pay

## 2016-12-14 LAB — CYTOLOGY - PAP: DIAGNOSIS: NEGATIVE

## 2016-12-14 NOTE — Telephone Encounter (Signed)
Spoke with pt about normal pap result

## 2017-05-31 ENCOUNTER — Encounter: Payer: Self-pay | Admitting: Radiation Oncology

## 2017-05-31 ENCOUNTER — Ambulatory Visit
Admission: RE | Admit: 2017-05-31 | Discharge: 2017-05-31 | Disposition: A | Discharge status: Home / Self Care | Payer: BLUE CROSS/BLUE SHIELD | Source: Ambulatory Visit | Attending: Radiation Oncology | Admitting: Radiation Oncology

## 2017-05-31 DIAGNOSIS — Z923 Personal history of irradiation: Secondary | ICD-10-CM | POA: Diagnosis not present

## 2017-05-31 DIAGNOSIS — Z8541 Personal history of malignant neoplasm of cervix uteri: Secondary | ICD-10-CM | POA: Insufficient documentation

## 2017-05-31 DIAGNOSIS — C538 Malignant neoplasm of overlapping sites of cervix uteri: Secondary | ICD-10-CM | POA: Diagnosis present

## 2017-05-31 NOTE — Progress Notes (Signed)
Stacy Buck is here for follow up.  She denies having pain.  She reports having occasional incontinence that improves when she takes oxybutynin.  She reports having constipation alternating with diarrhea.  She said they are always like this.  She denies having any vaginal bleeding or discharge.  She denies having fatigue.  She is using a vaginal dilator.  BP 120/75 (BP Location: Left Arm, Patient Position: Sitting)   Pulse 63   Temp 98.5 F (36.9 C) (Oral)   Ht 5\' 1"  (1.549 m)   Wt 146 lb 9.6 oz (66.5 kg)   LMP 03/31/2014   SpO2 100%   BMI 27.70 kg/m    Wt Readings from Last 3 Encounters:  05/31/17 146 lb 9.6 oz (66.5 kg)  12/08/16 149 lb (67.6 kg)  06/01/16 145 lb 3.2 oz (65.9 kg)

## 2017-05-31 NOTE — Progress Notes (Signed)
  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 She denies having pain.  She reports having occasional incontinence that improves when she takes oxybutynin.  She reports having constipation alternating with diarrhea.  She said they are always like this.  She denies having any vaginal bleeding or discharge.  She denies having fatigue.  She is using a vaginal dilator and denies bleeding upon use. Denies rectal bleeding.   ALLERGIES:  has No Known Allergies.  Meds: Current Outpatient Prescriptions  Medication Sig Dispense Refill  . estradiol (ESTRACE) 1 MG tablet Take 1 tablet (1 mg total) by mouth daily. 90 tablet 6  . FLUARIX QUADRIVALENT 0.5 ML injection TO BE ADMINISTERED BY PHARMACIST FOR IMMUNIZATION  0  . ibuprofen (ADVIL,MOTRIN) 600 MG tablet Take 1 tablet (600 mg total) by mouth every 6 (six) hours as needed. 20 tablet 0  . Multiple Vitamins-Minerals (MULTIVITAMIN GUMMIES ADULTS PO) Take 1 each by mouth daily.    Marland Kitchen oxybutynin (DITROPAN-XL) 5 MG 24 hr tablet Take 1 tablet (5 mg total) by mouth at bedtime. 30 tablet 6  . venlafaxine XR (EFFEXOR-XR) 75 MG 24 hr capsule Take 75 mg by mouth daily with breakfast.    . betamethasone dipropionate (DIPROLENE) 0.05 % cream APPLY TO AFFECTED AREA EVERY DAY  0   No current facility-administered medications for this encounter.     Physical Findings: The patient is in no acute distress. Patient is alert and oriented.  height is 5\' 1"  (1.549 m) and weight is 146 lb 9.6 oz (66.5 kg). Her  oral temperature is 98.5 F (36.9 C). Her blood pressure is 120/75 and her pulse is 63. Her oxygen saturation is 100%.  No palpable subclavicular or axillary adenopathy. Lungs are clear to auscultation. The heart has a regular rhythm and rate. The abdomen is soft and nontender with normal bowel sounds. No inguinal adenopathy appreciated.   On pelvic examination the external genitalia were unremarkable. A speculum exam was performed. There are no mucosal lesions noted in the vaginal vault. On bimanual and rectovaginal examination there were no pelvic masses appreciated. Sphincter tone was normal.    Impression:  No evidence of recurrence on clinical exam today.   Plan:  Pt will follow up with radiation oncology in one year. She will follow-up with Dr. Fermin Schwab in 6 months.  -----------------------------------  Blair Promise, PhD, MD  This document serves as a record of services personally performed by Gery Pray, MD. It was created on his behalf by Linward Natal, a trained medical scribe. The creation of this record is based on the scribe's personal observations and the provider's statements to them. This document has been checked and approved by the attending provider.

## 2017-06-07 ENCOUNTER — Ambulatory Visit: Payer: Self-pay | Admitting: Radiation Oncology

## 2017-07-24 IMAGING — CR DG ANKLE COMPLETE 3+V*L*
3 series · 3 of 3 positions shown · non-contrast
Comparison: None.

CLINICAL DATA: Twisting injury left ankle 1 month ago with
continued pain. Initial encounter.

EXAM:
LEFT ANKLE COMPLETE - 3+ VIEW

[t ankle joint ap left]
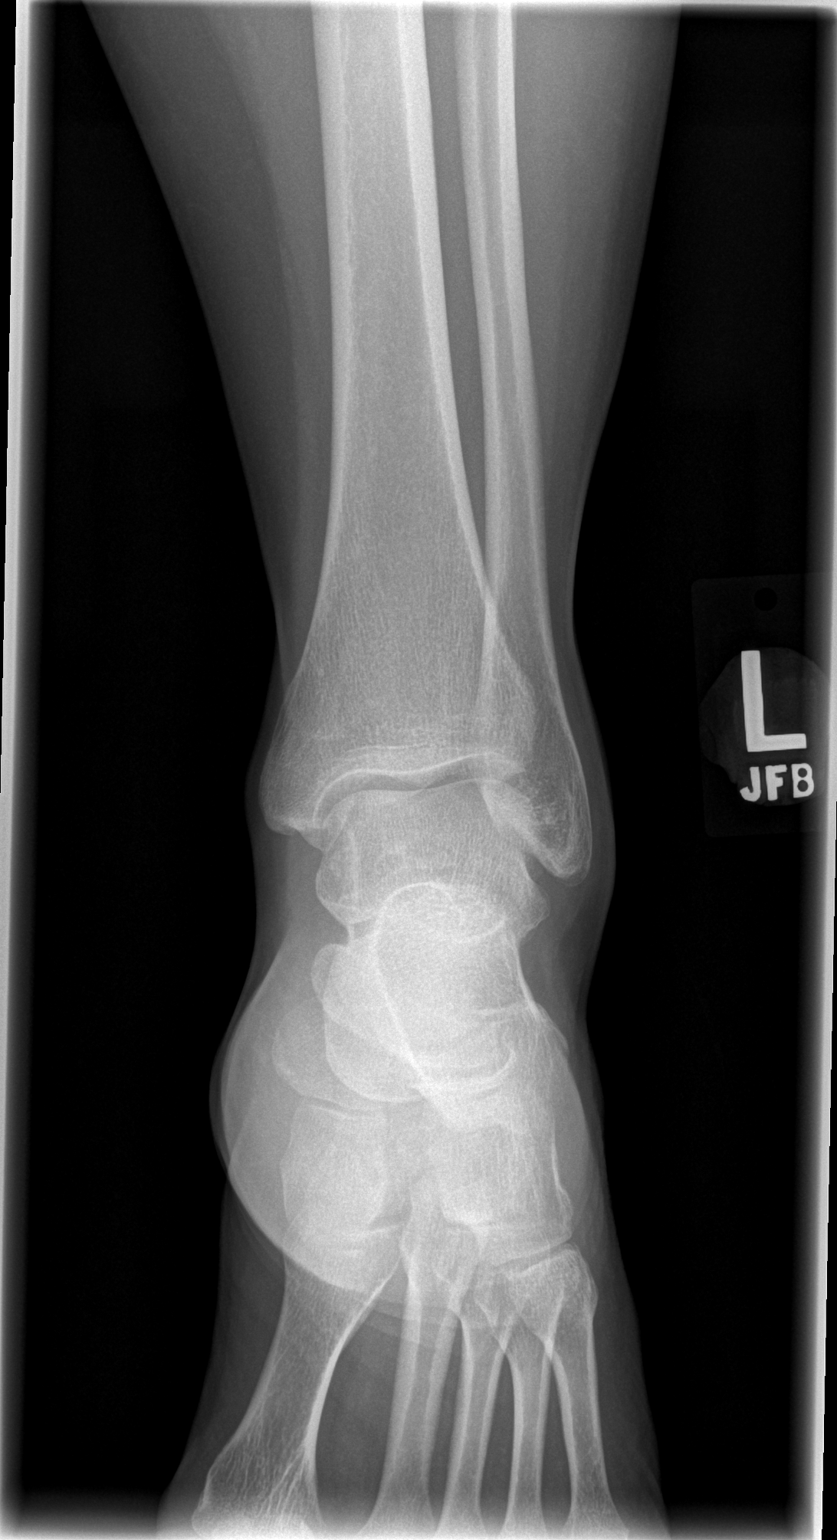

[t ankle joint oblique left]
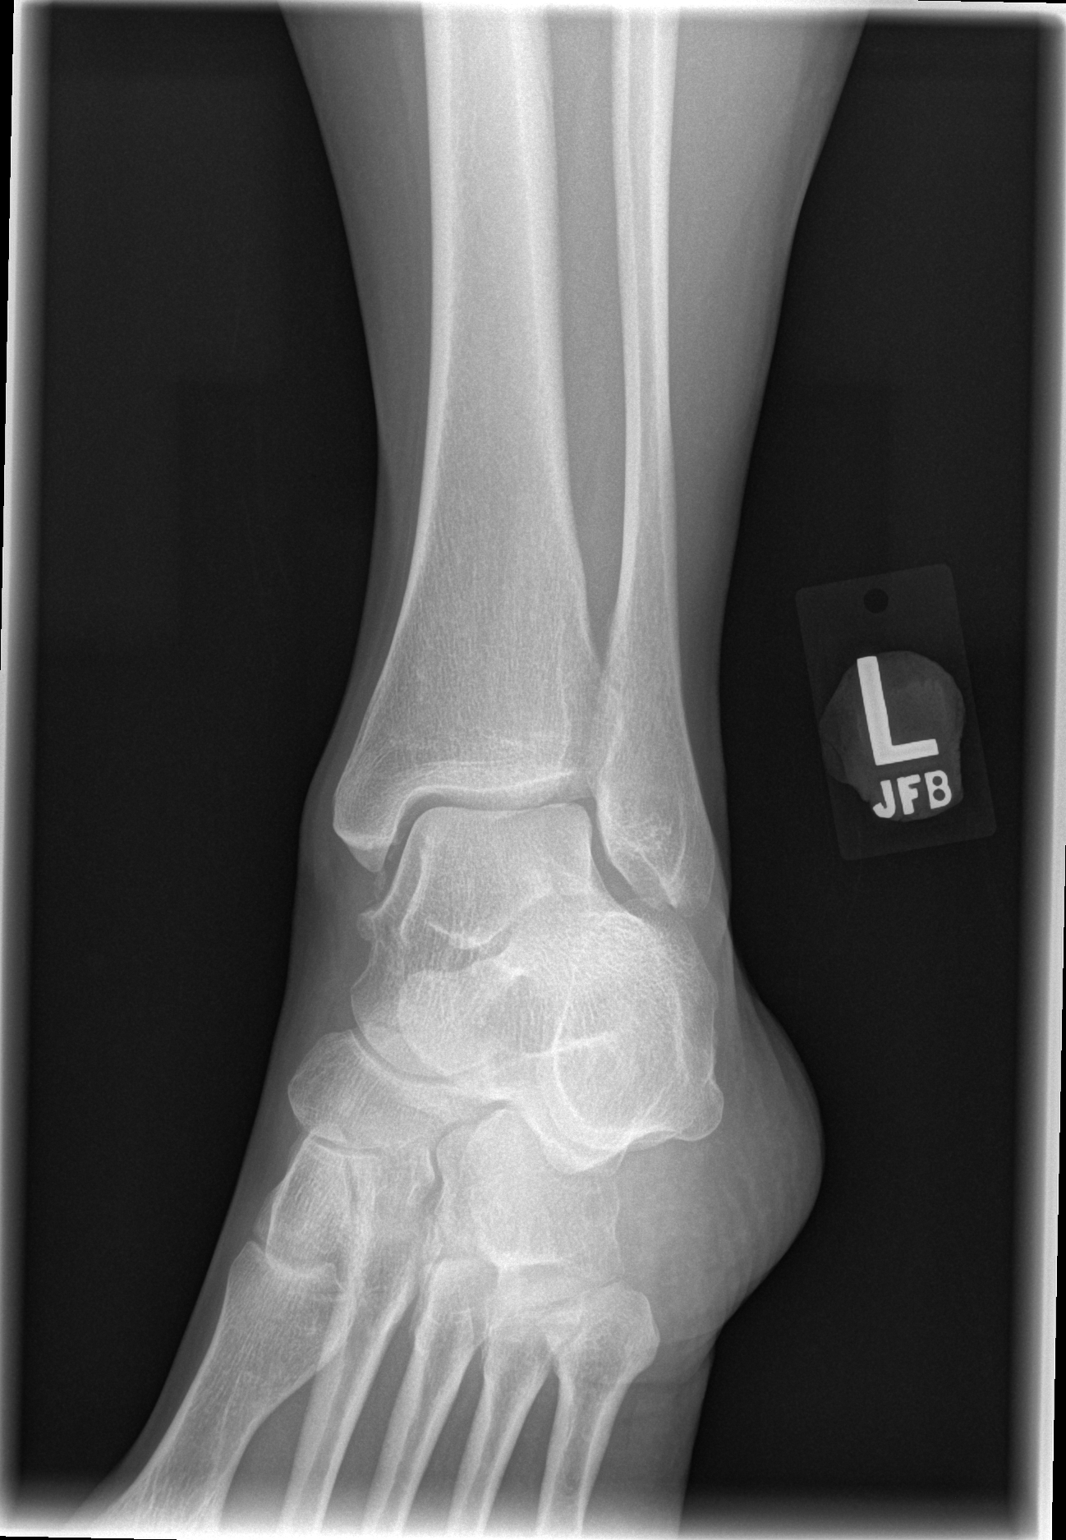

[t ankle joint lat left]
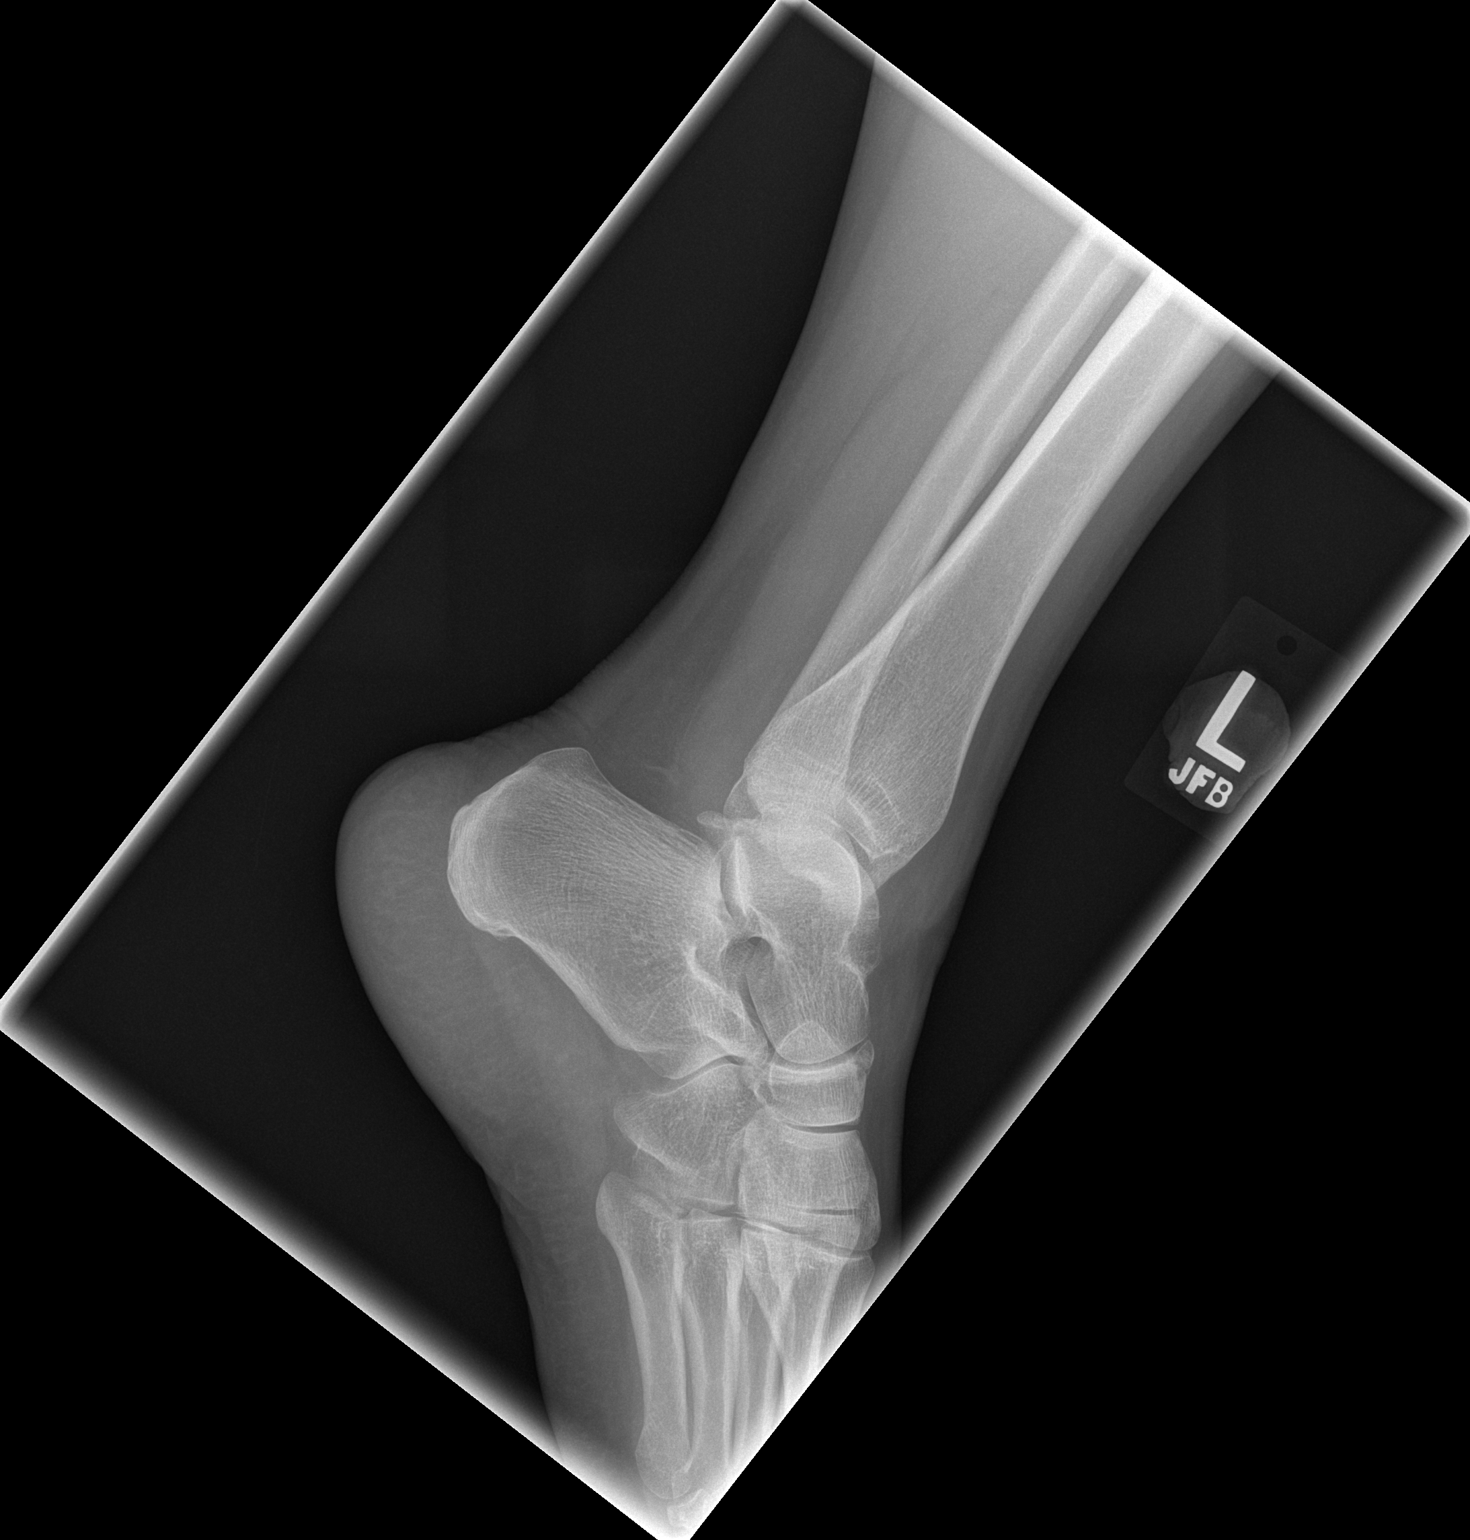

[3 of 3 positions shown; findings below may reference images not displayed]

FINDINGS: There is no evidence of fracture, dislocation, or joint effusion.
There is no evidence of arthropathy or other focal bone abnormality.
Soft tissues are unremarkable.
IMPRESSION: Negative exam.

## 2017-08-22 DIAGNOSIS — F411 Generalized anxiety disorder: Secondary | ICD-10-CM | POA: Insufficient documentation

## 2017-08-22 DIAGNOSIS — L409 Psoriasis, unspecified: Secondary | ICD-10-CM | POA: Insufficient documentation

## 2017-11-28 ENCOUNTER — Telehealth: Payer: Self-pay | Admitting: *Deleted

## 2017-11-28 NOTE — Telephone Encounter (Signed)
Returned the patient's call and schedueld the follow up appt for January

## 2017-12-13 ENCOUNTER — Telehealth: Payer: Self-pay | Admitting: *Deleted

## 2017-12-13 NOTE — Telephone Encounter (Signed)
Called and left the patient a message to call the office back. Need to move her appt from 1/4 to 1/11.

## 2017-12-14 ENCOUNTER — Other Ambulatory Visit (HOSPITAL_COMMUNITY)
Admission: RE | Admit: 2017-12-14 | Discharge: 2017-12-14 | Disposition: A | Discharge status: Home / Self Care | Payer: BLUE CROSS/BLUE SHIELD | Source: Ambulatory Visit | Attending: Gynecology | Admitting: Gynecology

## 2017-12-14 ENCOUNTER — Encounter: Payer: Self-pay | Admitting: Gynecology

## 2017-12-14 ENCOUNTER — Ambulatory Visit: Payer: BLUE CROSS/BLUE SHIELD | Attending: Gynecology | Admitting: Gynecology

## 2017-12-14 VITALS — BP 118/68 | HR 72 | Temp 98.5°F | Ht 61.0 in | Wt 146.2 lb

## 2017-12-14 DIAGNOSIS — Z87891 Personal history of nicotine dependence: Secondary | ICD-10-CM | POA: Insufficient documentation

## 2017-12-14 DIAGNOSIS — C539 Malignant neoplasm of cervix uteri, unspecified: Secondary | ICD-10-CM | POA: Diagnosis not present

## 2017-12-14 DIAGNOSIS — Z833 Family history of diabetes mellitus: Secondary | ICD-10-CM | POA: Insufficient documentation

## 2017-12-14 DIAGNOSIS — Z823 Family history of stroke: Secondary | ICD-10-CM | POA: Insufficient documentation

## 2017-12-14 DIAGNOSIS — Z90710 Acquired absence of both cervix and uterus: Secondary | ICD-10-CM | POA: Insufficient documentation

## 2017-12-14 DIAGNOSIS — Z923 Personal history of irradiation: Secondary | ICD-10-CM | POA: Diagnosis not present

## 2017-12-14 DIAGNOSIS — Z79899 Other long term (current) drug therapy: Secondary | ICD-10-CM | POA: Diagnosis not present

## 2017-12-14 MED ORDER — ESTRADIOL 2 MG PO TABS: 2.0000 mg | ORAL_TABLET | Freq: Every day | ORAL | 6 refills | Status: DC

## 2017-12-14 NOTE — Patient Instructions (Addendum)
We will contact you with the results of our pap smear from today.  Plan on following up with Dr. Sondra Come in six months and Dr. Fermin Schwab in one year.  Please call after you see Dr. Sondra Come to schedule your appt with Dr. Fermin Schwab.  Please call for any questions or concerns.

## 2017-12-14 NOTE — Progress Notes (Signed)
Consult Note: Gyn-Onc   Stacy Buck 36 y.o. female  Chief Complaint  Patient presents with  . Malignant neoplasm of cervix, unspecified site [facility])    Assessment : Stage IB 1 squamous cell carcinoma cervix. Clinically NED. Menopausal symptoms resolved using Estrace.. Symptoms of an unstable bladder.  Plan:    Pap smears are obtained today.  She is given a renewed prescription for Estrace 2 mg daily (this is a increased dose from previously).  Return to see me in one year. She will see Dr. Sondra Come in 6 months  Interval history: Patient returns today as previously scheduled. Patient currently is doing well. She denies any GI symptoms except for some urinary hesitancy and some minimal incontinence.  . She denies any pelvic pain pressure or bleeding.. She denies any vaginal discharge or bleeding her appetite is good and her functional status is excellent.  She has been on Estrace but finds that her menopausal symptoms are not controlled unless she takes 2 tablets a day (total dose of 2 mg daily)  HPI:  37 year old gravida 2 para 2 seen in consultation at the request of Dr. Christophe Louis regarding management of a newly diagnosed cervical carcinoma. Patient believes she had her last Pap smear in 2011. Recently she had a Pap smear showing atypical squamous cells and high-grade dysplasia cannot be ruled out. 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 In retrospect the patient reports she's had postcoital bleeding for several months. She denies any pelvic pain or any GI or GU symptoms. Functional status is excellent.  She had a CT scan obtained on [place] 7 that shows no evidence of metastatic disease  The patient underwent a radical hysterectomy and pelvic lymphadenectomy 03/31/2014. Final pathology showed a 4.5 cm lesion, deep cervical stromal invasion, and angiolymphatic involvement. She received postoperative whole pelvis radiation therapy. Total doses 50.4 gray  and 28 fractions completed on 07/09/2014. She tolerated the radiation therapy well with minimal side effects.    Review of Systems:10 point review of systems is negative except as noted in interval history.   Vitals: Last menstrual period 03/31/2014.  Physical Exam: General : The patient is a healthy woman in no acute distress.  HEENT: normocephalic, extraoccular movements normal; neck is supple without thyromegally  Lynphnodes: Supraclavicular and inguinal nodes not enlarged  Abdomen: Soft, non-tender, no ascites, no organomegally, no masses, no hernias Pfannenstiel incision is well-healed.  Pelvic:  EGBUS: Normal female  Vagina: Normal, no lesions cuff is well-healed. Urethra and Bladder: Normal, non-tender  Cervix: Surgically absent Uterus surgically absent  Bi-manual examination: Non-tender; no adenxal masses or nodularity  Rectal: normal sphincter tone, no masses, no blood , there is no parametrial involvement Lower extremities: No edema or varicosities. Normal range of motion      No Known Allergies  Past Medical History:  Diagnosis Date  . Anxiety   . Cancer ([place]) 03/11/2014   cervical cancer  . Psoriasis    patches on elbows, knees, legs ,abdomen, buttocks.   . Radiation 06/01/14-07/09/14   pelvis 50.4 gray    Past Surgical History:  Procedure Laterality Date  . ABDOMINAL HYSTERECTOMY    . childbirth     x2 NVD- epidural anesthesia  . RADICAL HYSTERECTOMY N/A 03/31/2014   Procedure: EXPLORATORY LAPAROTOMY/RADICAL HYSTERECTOMY/LYMPHNODE DISECTION;  Surgeon: Alvino Chapel, MD;  Location: [facility];  Service: Gynecology;  Laterality: N/A;    Current Outpatient Medications  Medication Sig Dispense Refill  . betamethasone dipropionate (DIPROLENE) 0.05 % cream  APPLY TO AFFECTED AREA EVERY DAY  0  . estradiol (ESTRACE) 1 MG tablet Take 1 tablet (1 mg total) by mouth daily. 90 tablet 6  . FLUARIX QUADRIVALENT 0.5 ML injection TO BE ADMINISTERED BY PHARMACIST  FOR IMMUNIZATION  0  . ibuprofen (ADVIL,MOTRIN) 600 MG tablet Take 1 tablet (600 mg total) by mouth every 6 (six) hours as needed. 20 tablet 0  . Multiple Vitamins-Minerals (MULTIVITAMIN GUMMIES ADULTS PO) Take 1 each by mouth daily.    Marland Kitchen oxybutynin (DITROPAN-XL) 5 MG 24 hr tablet Take 1 tablet (5 mg total) by mouth at bedtime. 30 tablet 6  . venlafaxine XR (EFFEXOR-XR) 75 MG 24 hr capsule Take 75 mg by mouth daily with breakfast.     No current facility-administered medications for this visit.     Social History   Socioeconomic History  . Marital status: Married    Spouse name: Not on file  . Number of children: 2  . Years of education: Not on file  . Highest education level: Not on file  Social Needs  . Financial resource strain: Not on file  . Food insecurity - worry: Not on file  . Food insecurity - inability: Not on file  . Transportation needs - medical: Not on file  . Transportation needs - non-medical: Not on file  Occupational History  . 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 Smoking status: Former Smoker    Packs/day: 0.50    Years: 15.00    Pack years: 7.50    Types: Cigarettes    Last attempt to quit: 08/11/2016    Years since quitting: 1.3  . Smokeless tobacco: Never Used  . Tobacco comment: cut back to 5 cigs per day  Substance and Sexual Activity  . Alcohol use: No    Alcohol/week: 0.0 oz  . Drug use: No  . Sexual activity: Yes  Other Topics Concern  . Not on file  Social History Narrative  . Not on file    Family History  Problem Relation Age of Onset  . Diabetes Father   . Coronary artery disease Father   . Stroke Maternal Grandfather   . Diabetes Paternal Grandmother       Marti Sleigh, MD 12/14/2017, 2:32 PM

## 2017-12-14 NOTE — Addendum Note (Signed)
Addended by: Joylene John D on: 12/14/2017 03:58 PM   Modules accepted: Orders

## 2017-12-18 ENCOUNTER — Telehealth: Payer: Self-pay

## 2017-12-18 LAB — CYTOLOGY - PAP: DIAGNOSIS: NEGATIVE

## 2017-12-18 NOTE — Telephone Encounter (Signed)
Pt notified about pap results: negative.  No questions or concerns voiced. 

## 2018-06-03 ENCOUNTER — Ambulatory Visit: Payer: Self-pay | Admitting: Radiation Oncology

## 2018-06-10 ENCOUNTER — Ambulatory Visit
Admission: RE | Admit: 2018-06-10 | Discharge: 2018-06-10 | Disposition: A | Discharge status: Home / Self Care | Payer: BLUE CROSS/BLUE SHIELD | Source: Ambulatory Visit | Attending: Radiation Oncology | Admitting: Radiation Oncology

## 2018-06-10 ENCOUNTER — Encounter: Payer: Self-pay | Admitting: Radiation Oncology

## 2018-06-10 ENCOUNTER — Other Ambulatory Visit: Payer: Self-pay

## 2018-06-10 VITALS — BP 112/93 | HR 64 | Temp 97.8°F | Resp 18 | Ht 61.0 in | Wt 151.0 lb

## 2018-06-10 DIAGNOSIS — Z8541 Personal history of malignant neoplasm of cervix uteri: Secondary | ICD-10-CM | POA: Insufficient documentation

## 2018-06-10 DIAGNOSIS — R32 Unspecified urinary incontinence: Secondary | ICD-10-CM | POA: Diagnosis not present

## 2018-06-10 DIAGNOSIS — C538 Malignant neoplasm of overlapping sites of cervix uteri: Secondary | ICD-10-CM

## 2018-06-10 DIAGNOSIS — Z08 Encounter for follow-up examination after completed treatment for malignant neoplasm: Secondary | ICD-10-CM | POA: Diagnosis not present

## 2018-06-10 NOTE — Progress Notes (Signed)
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 She has been doing well overall. She hasn't had any issues as of lately. She recently went to [place] with her daughter who cheers and she enjoyed it.   She has had  follow up with Dr. Fermin Schwab since the last visit. Pap smear that time was normal.  On review of systems, she reports abdominal bloating related to bowel movements. she denies dyspareunia, abdominal cramping, nausea, vomiting, back pain, dysuria, flank pain, vaginal discharge/bleeding, and any other symptoms. Pertinent positives are listed and detailed within the above HPI.    ALLERGIES:  has No Known Allergies.  Meds: Current Outpatient Medications  Medication Sig Dispense Refill  . estradiol (ESTRACE) 2 MG tablet Take 1 tablet (2 mg total) by mouth daily. 90 tablet 6  . ibuprofen (ADVIL,MOTRIN) 600 MG tablet Take 1 tablet (600 mg total) by mouth every 6 (six) hours as needed. 20 tablet 0  . Multiple Vitamins-Minerals (MULTIVITAMIN GUMMIES ADULTS PO) Take 1 each by mouth daily.    Marland Kitchen venlafaxine XR (EFFEXOR-XR) 75 MG 24 hr capsule Take 75 mg by mouth daily with breakfast.    . halobetasol (ULTRAVATE) 0.05 % cream TO THE AFFECTED AREA TWICE DAILY AS NEEDED  1  . minocycline (MINOCIN,DYNACIN) 50 MG capsule Take 50 mg by mouth 2 (two) times daily.  1  . oxybutynin (DITROPAN-XL) 5 MG 24 hr tablet Take 1 tablet (5 mg total) by mouth at bedtime. (Patient not taking: Reported on 06/10/2018) 30 tablet 6    No current facility-administered medications for this encounter.     Physical Findings: The patient is in no acute distress. Patient is alert and oriented.  height is 5\' 1"  (1.549 m) and weight is 151 lb (68.5 kg). Her oral temperature is 97.8 F (36.6 C). Her blood pressure is 112/93 (abnormal) and her pulse is 64. Her respiration is 18 and oxygen saturation is 100%.  No palpable subclavicular or axillary adenopathy. Lungs are clear to auscultation. The heart has a regular rhythm and rate. The abdomen is soft and nontender with normal bowel sounds. No inguinal adenopathy appreciated.   On pelvic examination the external genitalia were unremarkable. A speculum exam was performed. There are no mucosal lesions noted in the vaginal vault. On bimanual and rectovaginal examination there were no pelvic masses appreciated. Sphincter tone was normal. The patient developed significant urinary incontinence during the pelvic exam with significant urinary leakage. She admits that when her bladder is somewhat full, she has no control.    Impression: No evidence of recurrence on clinical exam today. The patient's having problems with urinary incontinence and is having to wear pads 24 hours a day. I recommended that she undergo physical therapy for pelvic floor rehab to see if this would help with her symptoms.   Plan:  Pt will follow-up with Dr. Fermin Schwab in 6 months and return to radiation oncology in 1 year. Orders placed for physical therapy evaluation and pelvic floor rehabilitation.  -----------------------------------  Blair Promise, PhD, MD  This document serves as a record of services personally performed by Gery Pray, MD. It was created on his behalf by Steva Colder, a trained medical scribe. The creation of this record is based on the scribe's personal observations and the provider's statements to them. This document has been checked and approved by the attending provider.

## 2018-06-10 NOTE — Progress Notes (Signed)
[name] is here for a follow-up appointment. Denies any pain. States that she has mild fatigue.Denies any vaginal discharge or bleeding. Denies any rectal bleeding. Denies any nausea or vomiting. Denies any constipation or diarrhea. Denies any dysuria or hematuria.Denies any skin issues. States that she is sexually active.  Vitals:   06/10/18 1043  BP: (!) 112/93  Pulse: 64  Resp: 18  Temp: 97.8 F (36.6 C)  TempSrc: Oral  SpO2: 100%  Weight: 151 lb (68.5 kg)  Height: 5\' 1"  (1.549 m)   Wt Readings from Last 3 Encounters:  06/10/18 151 lb (68.5 kg)  12/14/17 146 lb 3 oz (66.3 kg)  05/31/17 146 lb 9.6 oz (66.5 kg)

## 2018-06-24 ENCOUNTER — Ambulatory Visit: Payer: BLUE CROSS/BLUE SHIELD | Attending: Radiation Oncology | Admitting: Physical Therapy

## 2019-05-12 ENCOUNTER — Other Ambulatory Visit: Payer: Self-pay | Admitting: Gynecologic Oncology

## 2019-05-12 DIAGNOSIS — C539 Malignant neoplasm of cervix uteri, unspecified: Secondary | ICD-10-CM

## 2019-06-12 ENCOUNTER — Other Ambulatory Visit (HOSPITAL_COMMUNITY)
Admission: RE | Admit: 2019-06-12 | Discharge: 2019-06-12 | Disposition: A | Discharge status: Home / Self Care | Payer: BC Managed Care – PPO | Source: Ambulatory Visit | Attending: Radiation Oncology | Admitting: Radiation Oncology

## 2019-06-12 ENCOUNTER — Other Ambulatory Visit: Payer: Self-pay

## 2019-06-12 ENCOUNTER — Ambulatory Visit
Admission: RE | Admit: 2019-06-12 | Discharge: 2019-06-12 | Disposition: A | Discharge status: Home / Self Care | Payer: BC Managed Care – PPO | Source: Ambulatory Visit | Attending: Radiation Oncology | Admitting: Radiation Oncology

## 2019-06-12 ENCOUNTER — Encounter: Payer: Self-pay | Admitting: Radiation Oncology

## 2019-06-12 VITALS — BP 130/71 | HR 67 | Temp 98.0°F | Resp 20 | Ht 61.0 in | Wt 141.4 lb

## 2019-06-12 DIAGNOSIS — Z79899 Other long term (current) drug therapy: Secondary | ICD-10-CM | POA: Diagnosis not present

## 2019-06-12 DIAGNOSIS — K5900 Constipation, unspecified: Secondary | ICD-10-CM | POA: Diagnosis not present

## 2019-06-12 DIAGNOSIS — C530 Malignant neoplasm of endocervix: Secondary | ICD-10-CM

## 2019-06-12 DIAGNOSIS — R197 Diarrhea, unspecified: Secondary | ICD-10-CM | POA: Diagnosis not present

## 2019-06-12 DIAGNOSIS — R109 Unspecified abdominal pain: Secondary | ICD-10-CM | POA: Insufficient documentation

## 2019-06-12 DIAGNOSIS — C539 Malignant neoplasm of cervix uteri, unspecified: Secondary | ICD-10-CM | POA: Insufficient documentation

## 2019-06-12 NOTE — Progress Notes (Signed)
Pt presents today for f/u with Dr. Sondra Come. Pt denies c/o pain. Pt with questions re: safety of injections for psoriasis and cancer risk. Pt denies dysuria/hematuria. Pt denies vaginal bleeding/discharge. Pt denies rectal bleeding. Pt reports baseline alternating between diarrhea/constipation. Pt reports abdominal bloating associated with constipation. Pt reports baseline nausea without vomiting related to psoriasis medication.  BP 130/71 (BP Location: Right Arm, Patient Position: Sitting)   Pulse 67   Temp 98 F (36.7 C) (Oral)   Resp 20   Ht 5\' 1"  (1.549 m)   Wt 141 lb 6.4 oz (64.1 kg)   LMP 03/31/2014   SpO2 100%   BMI 26.72 kg/m   Wt Readings from Last 3 Encounters:  06/12/19 141 lb 6.4 oz (64.1 kg)  06/10/18 151 lb (68.5 kg)  12/14/17 146 lb 3 oz (66.3 kg)   Loma Sousa, RN BSN

## 2019-06-12 NOTE — Progress Notes (Signed)
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 She has not seen Dr. Fermin Schwab since 12/2017. She also did not show to her pelvic floor rehabilitation appointment in 06/2018.  She reports baseline alternating diarrhea/constipation, as well as baseline nausea without vomiting associated with her psoriasis mediation. She also reports abdominal bloating associated with constipation. She denies pain, dysuria or hematuria, vaginal discharge or bleeding, and rectal bleeding.  Overall her urinary incontinence is better after she performed Kegel exercises.  She does not wish to be referred to physical therapy for pelvic floor issues at this time.  She does have to wear a pad daily but seems comfortable with this situation.    ALLERGIES:  has No Known Allergies.  Meds: Current Outpatient Medications  Medication Sig Dispense Refill   estradiol (ESTRACE) 2 MG tablet TAKE ONE TABLET BY MOUTH DAILY 90 tablet 6   halobetasol (ULTRAVATE) 0.05 % cream TO THE AFFECTED AREA TWICE DAILY AS NEEDED  1   ibuprofen (ADVIL,MOTRIN) 600 MG tablet Take 1 tablet (600 mg total) by mouth every 6 (six) hours as needed. 20 tablet 0   Multiple Vitamins-Minerals (MULTIVITAMIN GUMMIES ADULTS PO) Take 1 each by mouth daily.     OTEZLA 30 MG TABS      venlafaxine XR (EFFEXOR-XR) 75 MG 24 hr capsule Take 75 mg by mouth daily with breakfast.     oxybutynin (DITROPAN-XL) 5 MG 24 hr tablet Take 1 tablet (5 mg total) by mouth at bedtime.  (Patient not taking: Reported on 06/10/2018) 30 tablet 6   No current facility-administered medications for this encounter.     Physical Findings: The patient is in no acute distress. Patient is alert and oriented.  height is 5\' 1"  (1.549 m) and weight is 141 lb 6.4 oz (64.1 kg). Her oral temperature is 98 F (36.7 C). Her blood pressure is 130/71 and her pulse is 67. Her respiration is 20 and oxygen saturation is 100%.  No palpable subclavicular or axillary adenopathy. Lungs are clear to auscultation. The heart has a regular rhythm and rate. The abdomen is soft and nontender with normal bowel sounds. No inguinal adenopathy appreciated.    {On pelvic examination the external genitalia were unremarkable. A speculum exam was performed. There are no mucosal lesions noted in the vaginal vault. On bimanual and rectovaginal examination there were no pelvic masses appreciated. Sphincter tone was normal.  Pap smear obtained of the proximal vagina  Impression: No evidence of recurrence on clinical exam today, pending Pap smear results from today.  Plan: The patient has completed 5 years of follow-up for her cervical cancer.  Congratulated the patient on this accomplishment.  the risk for recurrence at this time would be low.  Patient will be released from radiation oncology and gynecologic oncology for follow-up.  She will have her primary care physician Lorenza Evangelist) perform yearly pelvic exams and Pap smears.  We are always available for questions or for exam if needed.   -----------------------------------  Blair Promise, PhD, MD  This document serves as a  record of services personally performed by Gery Pray, MD. It was created on his behalf by Wilburn Mylar, a trained medical scribe. The creation of this record is based on the scribe's personal observations and the provider's statements to them. This document has been checked and approved by the attending provider.

## 2019-06-12 NOTE — Patient Instructions (Signed)
Coronavirus (COVID-19) Are you at risk?  Are you at risk for the Coronavirus (COVID-19)?  To be considered HIGH RISK for Coronavirus (COVID-19), you have to meet the following criteria:  . Traveled to [place], [place], [place], [place] or [place]; or in the [place] to [place], [place], [place], or [place]; and have fever, cough, and shortness of breath within the last 2 weeks of travel OR . Been in close contact with a person diagnosed with COVID-19 within the last 2 weeks and have fever, cough, and shortness of breath . IF YOU DO NOT MEET THESE CRITERIA, YOU ARE CONSIDERED LOW RISK FOR COVID-19.  What to do if you are HIGH RISK for COVID-19?  . If you are having a medical emergency, call 911. . Seek medical care right away. Before you go to a doctor's office, urgent care or emergency department, call ahead and tell them about your recent travel, contact with someone diagnosed with COVID-19, and your symptoms. You should receive instructions from your physician's office regarding next steps of care.  . When you arrive at healthcare provider, tell the healthcare staff immediately you have returned from visiting [place], [place], [place], [place] or [place]; or traveled in the [place] to [place], [place], [place], or [place]; in the last two weeks or you have been in close contact with a person diagnosed with COVID-19 in the last 2 weeks.   . Tell the health care staff about your symptoms: fever, cough and shortness of breath. . After you have been seen by a medical provider, you will be either: o Tested for (COVID-19) and discharged home on quarantine except to seek medical care if symptoms worsen, and asked to  - Stay home and avoid contact with others until you get your results (4-5 days)  - Avoid travel on public transportation if possible (such as bus, train, or airplane) or o Sent to the Emergency Department by EMS for evaluation, COVID-19 testing, and possible  admission depending on your condition and test results.  What to do if you are LOW RISK for COVID-19?  Reduce your risk of any infection by using the same precautions used for avoiding the common cold or flu:  . Wash your hands often with soap and warm water for at least 20 seconds.  If soap and water are not readily available, use an alcohol-based hand sanitizer with at least 60% alcohol.  . If coughing or sneezing, cover your mouth and nose by coughing or sneezing into the elbow areas of your shirt or coat, into a tissue or into your sleeve (not your hands). . Avoid shaking hands with others and consider head nods or verbal greetings only. . Avoid touching your eyes, nose, or mouth with unwashed hands.  . Avoid close contact with people who are sick. . Avoid places or events with large numbers of people in one location, like concerts or sporting events. . Carefully consider travel plans you have or are making. . If you are planning any travel outside or inside the [place], visit the CDC's Travelers' Health webpage for the latest health notices. . If you have some symptoms but not all symptoms, continue to monitor at home and seek medical attention if your symptoms worsen. . If you are having a medical emergency, call 911.   ADDITIONAL HEALTHCARE OPTIONS FOR PATIENTS  [facility] / e-Visit: [email or web address]         [facility]: [phone]  [facility]: [phone]                   [facility]: [phone]   

## 2019-06-16 LAB — CYTOLOGY - PAP: Diagnosis: NEGATIVE

## 2019-06-23 ENCOUNTER — Telehealth: Payer: Self-pay

## 2019-06-23 NOTE — Telephone Encounter (Signed)
Contacted pt to convey per Dr. Sondra Come that recent Pap results were good. Pt verbalized understanding and agreement. Pt encouraged to contact office for any further questions/concerns. Loma Sousa, RN BSN

## 2020-09-20 ENCOUNTER — Other Ambulatory Visit: Payer: Self-pay | Admitting: Gynecologic Oncology

## 2020-09-20 DIAGNOSIS — C539 Malignant neoplasm of cervix uteri, unspecified: Secondary | ICD-10-CM

## 2020-09-21 ENCOUNTER — Telehealth: Payer: Self-pay

## 2020-09-21 NOTE — Telephone Encounter (Signed)
Told her that Joylene John, NP gave her 1 refill on the estradiol.  She has not been seen since 06-2019.  Pt will have her PCP refill estradiol from here.
